# Patient Record
Sex: Male | Born: 1975 | Race: Black or African American | Hispanic: No | Marital: Married | Smoking: Never smoker
Health system: Southern US, Community
[De-identification: ages and names within clinical notes are randomized; demographics above are authoritative.]

## PROBLEM LIST (undated history)

## (undated) DIAGNOSIS — I10 Essential (primary) hypertension: Secondary | ICD-10-CM

## (undated) DIAGNOSIS — T7840XA Allergy, unspecified, initial encounter: Secondary | ICD-10-CM

## (undated) DIAGNOSIS — J45909 Unspecified asthma, uncomplicated: Secondary | ICD-10-CM

## (undated) DIAGNOSIS — Z9119 Patient's noncompliance with other medical treatment and regimen: Secondary | ICD-10-CM

## (undated) DIAGNOSIS — Z91199 Patient's noncompliance with other medical treatment and regimen due to unspecified reason: Secondary | ICD-10-CM

## (undated) HISTORY — DX: Allergy, unspecified, initial encounter: T78.40XA

## (undated) HISTORY — PX: APPENDECTOMY: SHX54

## (undated) HISTORY — PX: HERNIA REPAIR: SHX51

## (undated) HISTORY — PX: KNEE SURGERY: SHX244

## (undated) HISTORY — DX: Unspecified asthma, uncomplicated: J45.909

## (undated) HISTORY — PX: FRACTURE SURGERY: SHX138

---

## 1997-05-14 ENCOUNTER — Emergency Department (HOSPITAL_COMMUNITY): Admission: EM | Admit: 1997-05-14 | Discharge: 1997-05-14 | Payer: Self-pay | Admitting: Emergency Medicine

## 1997-12-16 ENCOUNTER — Encounter: Payer: Self-pay | Admitting: Emergency Medicine

## 1997-12-16 ENCOUNTER — Emergency Department (HOSPITAL_COMMUNITY): Admission: EM | Admit: 1997-12-16 | Discharge: 1997-12-16 | Payer: Self-pay | Admitting: Emergency Medicine

## 1998-04-12 ENCOUNTER — Emergency Department (HOSPITAL_COMMUNITY): Admission: EM | Admit: 1998-04-12 | Discharge: 1998-04-12 | Payer: Self-pay | Admitting: Emergency Medicine

## 1998-06-07 ENCOUNTER — Emergency Department (HOSPITAL_COMMUNITY): Admission: EM | Admit: 1998-06-07 | Discharge: 1998-06-07 | Payer: Self-pay | Admitting: Emergency Medicine

## 1998-07-01 ENCOUNTER — Emergency Department (HOSPITAL_COMMUNITY): Admission: EM | Admit: 1998-07-01 | Discharge: 1998-07-01 | Payer: Self-pay | Admitting: Emergency Medicine

## 1998-07-26 ENCOUNTER — Emergency Department (HOSPITAL_COMMUNITY): Admission: EM | Admit: 1998-07-26 | Discharge: 1998-07-26 | Payer: Self-pay | Admitting: Endocrinology

## 1998-10-06 ENCOUNTER — Encounter: Payer: Self-pay | Admitting: Emergency Medicine

## 1998-10-06 ENCOUNTER — Emergency Department (HOSPITAL_COMMUNITY): Admission: EM | Admit: 1998-10-06 | Discharge: 1998-10-06 | Payer: Self-pay | Admitting: Emergency Medicine

## 1998-10-09 ENCOUNTER — Emergency Department (HOSPITAL_COMMUNITY): Admission: EM | Admit: 1998-10-09 | Discharge: 1998-10-09 | Payer: Self-pay | Admitting: Emergency Medicine

## 1998-10-15 ENCOUNTER — Emergency Department (HOSPITAL_COMMUNITY): Admission: EM | Admit: 1998-10-15 | Discharge: 1998-10-15 | Payer: Self-pay | Admitting: Emergency Medicine

## 1998-12-22 ENCOUNTER — Emergency Department (HOSPITAL_COMMUNITY): Admission: EM | Admit: 1998-12-22 | Discharge: 1998-12-22 | Payer: Self-pay | Admitting: Internal Medicine

## 1999-01-05 ENCOUNTER — Emergency Department (HOSPITAL_COMMUNITY): Admission: EM | Admit: 1999-01-05 | Discharge: 1999-01-05 | Payer: Self-pay | Admitting: Emergency Medicine

## 1999-01-23 ENCOUNTER — Emergency Department (HOSPITAL_COMMUNITY): Admission: EM | Admit: 1999-01-23 | Discharge: 1999-01-23 | Payer: Self-pay | Admitting: Emergency Medicine

## 1999-01-25 ENCOUNTER — Emergency Department (HOSPITAL_COMMUNITY): Admission: EM | Admit: 1999-01-25 | Discharge: 1999-01-25 | Payer: Self-pay | Admitting: Emergency Medicine

## 1999-04-01 ENCOUNTER — Encounter (INDEPENDENT_AMBULATORY_CARE_PROVIDER_SITE_OTHER): Payer: Self-pay | Admitting: Specialist

## 1999-04-02 ENCOUNTER — Inpatient Hospital Stay (HOSPITAL_COMMUNITY): Admission: EM | Admit: 1999-04-02 | Discharge: 1999-04-03 | Payer: Self-pay | Admitting: Emergency Medicine

## 1999-04-02 ENCOUNTER — Encounter: Payer: Self-pay | Admitting: *Deleted

## 1999-06-14 ENCOUNTER — Emergency Department (HOSPITAL_COMMUNITY): Admission: EM | Admit: 1999-06-14 | Discharge: 1999-06-14 | Payer: Self-pay | Admitting: Emergency Medicine

## 1999-06-15 ENCOUNTER — Encounter: Payer: Self-pay | Admitting: Emergency Medicine

## 1999-07-02 ENCOUNTER — Emergency Department (HOSPITAL_COMMUNITY): Admission: EM | Admit: 1999-07-02 | Discharge: 1999-07-02 | Payer: Self-pay | Admitting: Emergency Medicine

## 1999-10-17 ENCOUNTER — Emergency Department (HOSPITAL_COMMUNITY): Admission: EM | Admit: 1999-10-17 | Discharge: 1999-10-17 | Payer: Self-pay | Admitting: Emergency Medicine

## 1999-10-17 ENCOUNTER — Encounter: Payer: Self-pay | Admitting: Emergency Medicine

## 1999-11-21 ENCOUNTER — Encounter: Payer: Self-pay | Admitting: Emergency Medicine

## 1999-11-21 ENCOUNTER — Emergency Department (HOSPITAL_COMMUNITY): Admission: EM | Admit: 1999-11-21 | Discharge: 1999-11-21 | Payer: Self-pay | Admitting: Emergency Medicine

## 1999-11-28 ENCOUNTER — Inpatient Hospital Stay (HOSPITAL_COMMUNITY): Admission: EM | Admit: 1999-11-28 | Discharge: 1999-11-28 | Payer: Self-pay | Admitting: Emergency Medicine

## 1999-11-30 ENCOUNTER — Encounter (INDEPENDENT_AMBULATORY_CARE_PROVIDER_SITE_OTHER): Payer: Self-pay | Admitting: Specialist

## 1999-11-30 ENCOUNTER — Ambulatory Visit (HOSPITAL_COMMUNITY): Admission: RE | Admit: 1999-11-30 | Discharge: 1999-11-30 | Payer: Self-pay | Admitting: Gastroenterology

## 2000-01-26 ENCOUNTER — Encounter: Payer: Self-pay | Admitting: Emergency Medicine

## 2000-01-26 ENCOUNTER — Emergency Department (HOSPITAL_COMMUNITY): Admission: AD | Admit: 2000-01-26 | Discharge: 2000-01-27 | Payer: Self-pay | Admitting: Emergency Medicine

## 2000-01-26 ENCOUNTER — Emergency Department (HOSPITAL_COMMUNITY): Admission: EM | Admit: 2000-01-26 | Discharge: 2000-01-26 | Payer: Self-pay | Admitting: Emergency Medicine

## 2000-02-19 ENCOUNTER — Emergency Department (HOSPITAL_COMMUNITY): Admission: EM | Admit: 2000-02-19 | Discharge: 2000-02-20 | Payer: Self-pay | Admitting: Emergency Medicine

## 2000-03-30 ENCOUNTER — Emergency Department (HOSPITAL_COMMUNITY): Admission: EM | Admit: 2000-03-30 | Discharge: 2000-03-30 | Payer: Self-pay | Admitting: Emergency Medicine

## 2000-03-31 ENCOUNTER — Encounter: Payer: Self-pay | Admitting: Emergency Medicine

## 2000-05-09 ENCOUNTER — Encounter: Payer: Self-pay | Admitting: Emergency Medicine

## 2000-05-09 ENCOUNTER — Emergency Department (HOSPITAL_COMMUNITY): Admission: EM | Admit: 2000-05-09 | Discharge: 2000-05-09 | Payer: Self-pay | Admitting: Emergency Medicine

## 2000-08-20 ENCOUNTER — Encounter: Payer: Self-pay | Admitting: Emergency Medicine

## 2000-08-20 ENCOUNTER — Emergency Department (HOSPITAL_COMMUNITY): Admission: EM | Admit: 2000-08-20 | Discharge: 2000-08-20 | Payer: Self-pay | Admitting: Emergency Medicine

## 2000-09-16 ENCOUNTER — Emergency Department (HOSPITAL_COMMUNITY): Admission: EM | Admit: 2000-09-16 | Discharge: 2000-09-16 | Payer: Self-pay | Admitting: Emergency Medicine

## 2000-09-16 ENCOUNTER — Encounter: Payer: Self-pay | Admitting: Emergency Medicine

## 2000-09-20 ENCOUNTER — Emergency Department (HOSPITAL_COMMUNITY): Admission: EM | Admit: 2000-09-20 | Discharge: 2000-09-20 | Payer: Self-pay | Admitting: Emergency Medicine

## 2000-10-01 ENCOUNTER — Emergency Department (HOSPITAL_COMMUNITY): Admission: EM | Admit: 2000-10-01 | Discharge: 2000-10-01 | Payer: Self-pay

## 2000-12-14 ENCOUNTER — Emergency Department (HOSPITAL_COMMUNITY): Admission: EM | Admit: 2000-12-14 | Discharge: 2000-12-14 | Payer: Self-pay | Admitting: Emergency Medicine

## 2001-02-06 ENCOUNTER — Emergency Department (HOSPITAL_COMMUNITY): Admission: EM | Admit: 2001-02-06 | Discharge: 2001-02-06 | Payer: Self-pay | Admitting: *Deleted

## 2001-05-08 ENCOUNTER — Encounter: Payer: Self-pay | Admitting: Emergency Medicine

## 2001-05-08 ENCOUNTER — Emergency Department (HOSPITAL_COMMUNITY): Admission: EM | Admit: 2001-05-08 | Discharge: 2001-05-08 | Payer: Self-pay | Admitting: Emergency Medicine

## 2001-11-26 ENCOUNTER — Emergency Department (HOSPITAL_COMMUNITY): Admission: EM | Admit: 2001-11-26 | Discharge: 2001-11-27 | Payer: Self-pay | Admitting: *Deleted

## 2001-11-28 ENCOUNTER — Emergency Department (HOSPITAL_COMMUNITY): Admission: EM | Admit: 2001-11-28 | Discharge: 2001-11-28 | Payer: Self-pay | Admitting: Emergency Medicine

## 2002-01-16 ENCOUNTER — Emergency Department (HOSPITAL_COMMUNITY): Admission: EM | Admit: 2002-01-16 | Discharge: 2002-01-16 | Payer: Self-pay | Admitting: Emergency Medicine

## 2002-01-16 ENCOUNTER — Encounter: Payer: Self-pay | Admitting: Emergency Medicine

## 2002-04-04 ENCOUNTER — Emergency Department (HOSPITAL_COMMUNITY): Admission: EM | Admit: 2002-04-04 | Discharge: 2002-04-04 | Payer: Self-pay

## 2002-04-04 ENCOUNTER — Encounter: Payer: Self-pay | Admitting: Emergency Medicine

## 2002-04-25 ENCOUNTER — Emergency Department (HOSPITAL_COMMUNITY): Admission: EM | Admit: 2002-04-25 | Discharge: 2002-04-26 | Payer: Self-pay | Admitting: Unknown Physician Specialty

## 2002-05-19 ENCOUNTER — Emergency Department (HOSPITAL_COMMUNITY): Admission: EM | Admit: 2002-05-19 | Discharge: 2002-05-19 | Payer: Self-pay

## 2002-05-19 ENCOUNTER — Emergency Department (HOSPITAL_COMMUNITY): Admission: EM | Admit: 2002-05-19 | Discharge: 2002-05-19 | Payer: Self-pay | Admitting: Unknown Physician Specialty

## 2002-05-23 ENCOUNTER — Emergency Department (HOSPITAL_COMMUNITY): Admission: EM | Admit: 2002-05-23 | Discharge: 2002-05-23 | Payer: Self-pay | Admitting: Emergency Medicine

## 2002-06-18 ENCOUNTER — Emergency Department (HOSPITAL_COMMUNITY): Admission: EM | Admit: 2002-06-18 | Discharge: 2002-06-18 | Payer: Self-pay | Admitting: Emergency Medicine

## 2002-06-18 ENCOUNTER — Encounter: Payer: Self-pay | Admitting: Emergency Medicine

## 2002-08-16 ENCOUNTER — Emergency Department (HOSPITAL_COMMUNITY): Admission: EM | Admit: 2002-08-16 | Discharge: 2002-08-17 | Payer: Self-pay | Admitting: Emergency Medicine

## 2002-08-19 ENCOUNTER — Inpatient Hospital Stay (HOSPITAL_COMMUNITY): Admission: EM | Admit: 2002-08-19 | Discharge: 2002-08-20 | Payer: Self-pay | Admitting: Emergency Medicine

## 2003-02-08 ENCOUNTER — Emergency Department (HOSPITAL_COMMUNITY): Admission: EM | Admit: 2003-02-08 | Discharge: 2003-02-08 | Payer: Self-pay | Admitting: Emergency Medicine

## 2003-04-16 ENCOUNTER — Emergency Department (HOSPITAL_COMMUNITY): Admission: EM | Admit: 2003-04-16 | Discharge: 2003-04-17 | Payer: Self-pay | Admitting: Emergency Medicine

## 2003-04-22 ENCOUNTER — Emergency Department (HOSPITAL_COMMUNITY): Admission: EM | Admit: 2003-04-22 | Discharge: 2003-04-22 | Payer: Self-pay | Admitting: Emergency Medicine

## 2003-08-10 ENCOUNTER — Emergency Department (HOSPITAL_COMMUNITY): Admission: EM | Admit: 2003-08-10 | Discharge: 2003-08-10 | Payer: Self-pay | Admitting: Emergency Medicine

## 2003-08-31 ENCOUNTER — Emergency Department (HOSPITAL_COMMUNITY): Admission: EM | Admit: 2003-08-31 | Discharge: 2003-09-01 | Payer: Self-pay | Admitting: Emergency Medicine

## 2003-10-11 ENCOUNTER — Ambulatory Visit: Payer: Self-pay | Admitting: Internal Medicine

## 2003-11-08 ENCOUNTER — Ambulatory Visit: Payer: Self-pay | Admitting: Internal Medicine

## 2003-11-25 ENCOUNTER — Ambulatory Visit: Payer: Self-pay | Admitting: Internal Medicine

## 2003-12-20 ENCOUNTER — Ambulatory Visit: Payer: Self-pay | Admitting: Internal Medicine

## 2004-01-27 ENCOUNTER — Ambulatory Visit: Payer: Self-pay | Admitting: Internal Medicine

## 2004-01-27 ENCOUNTER — Ambulatory Visit: Payer: Self-pay | Admitting: *Deleted

## 2004-02-10 ENCOUNTER — Ambulatory Visit: Payer: Self-pay | Admitting: Internal Medicine

## 2004-02-11 ENCOUNTER — Emergency Department (HOSPITAL_COMMUNITY): Admission: EM | Admit: 2004-02-11 | Discharge: 2004-02-12 | Payer: Self-pay | Admitting: Emergency Medicine

## 2004-02-13 ENCOUNTER — Ambulatory Visit: Payer: Self-pay | Admitting: Internal Medicine

## 2004-03-04 ENCOUNTER — Inpatient Hospital Stay (HOSPITAL_COMMUNITY): Admission: EM | Admit: 2004-03-04 | Discharge: 2004-03-05 | Payer: Self-pay | Admitting: Emergency Medicine

## 2004-03-09 ENCOUNTER — Ambulatory Visit: Payer: Self-pay | Admitting: Internal Medicine

## 2004-03-13 ENCOUNTER — Ambulatory Visit: Payer: Self-pay | Admitting: Internal Medicine

## 2004-05-01 ENCOUNTER — Ambulatory Visit: Payer: Self-pay | Admitting: Internal Medicine

## 2004-06-02 ENCOUNTER — Ambulatory Visit: Payer: Self-pay | Admitting: Family Medicine

## 2004-07-07 ENCOUNTER — Emergency Department (HOSPITAL_COMMUNITY): Admission: EM | Admit: 2004-07-07 | Discharge: 2004-07-07 | Payer: Self-pay | Admitting: Emergency Medicine

## 2004-07-22 ENCOUNTER — Emergency Department (HOSPITAL_COMMUNITY): Admission: EM | Admit: 2004-07-22 | Discharge: 2004-07-22 | Payer: Self-pay | Admitting: Family Medicine

## 2004-08-10 ENCOUNTER — Emergency Department (HOSPITAL_COMMUNITY): Admission: EM | Admit: 2004-08-10 | Discharge: 2004-08-10 | Payer: Self-pay | Admitting: Emergency Medicine

## 2004-09-30 ENCOUNTER — Emergency Department (HOSPITAL_COMMUNITY): Admission: EM | Admit: 2004-09-30 | Discharge: 2004-09-30 | Payer: Self-pay | Admitting: Emergency Medicine

## 2004-11-09 ENCOUNTER — Emergency Department (HOSPITAL_COMMUNITY): Admission: EM | Admit: 2004-11-09 | Discharge: 2004-11-10 | Payer: Self-pay | Admitting: Emergency Medicine

## 2004-11-18 ENCOUNTER — Emergency Department (HOSPITAL_COMMUNITY): Admission: EM | Admit: 2004-11-18 | Discharge: 2004-11-18 | Payer: Self-pay | Admitting: Emergency Medicine

## 2004-12-14 ENCOUNTER — Emergency Department (HOSPITAL_COMMUNITY): Admission: EM | Admit: 2004-12-14 | Discharge: 2004-12-14 | Payer: Self-pay | Admitting: Emergency Medicine

## 2004-12-19 ENCOUNTER — Emergency Department (HOSPITAL_COMMUNITY): Admission: EM | Admit: 2004-12-19 | Discharge: 2004-12-19 | Payer: Self-pay | Admitting: Family Medicine

## 2005-01-07 ENCOUNTER — Ambulatory Visit: Payer: Self-pay | Admitting: Internal Medicine

## 2005-01-15 ENCOUNTER — Emergency Department (HOSPITAL_COMMUNITY): Admission: EM | Admit: 2005-01-15 | Discharge: 2005-01-15 | Payer: Self-pay | Admitting: Emergency Medicine

## 2005-03-25 ENCOUNTER — Emergency Department (HOSPITAL_COMMUNITY): Admission: EM | Admit: 2005-03-25 | Discharge: 2005-03-25 | Payer: Self-pay | Admitting: Emergency Medicine

## 2005-04-13 ENCOUNTER — Ambulatory Visit: Payer: Self-pay | Admitting: Internal Medicine

## 2005-06-07 ENCOUNTER — Ambulatory Visit: Payer: Self-pay | Admitting: Internal Medicine

## 2005-06-29 ENCOUNTER — Emergency Department (HOSPITAL_COMMUNITY): Admission: EM | Admit: 2005-06-29 | Discharge: 2005-06-29 | Payer: Self-pay | Admitting: Emergency Medicine

## 2005-07-08 ENCOUNTER — Ambulatory Visit: Payer: Self-pay | Admitting: Internal Medicine

## 2005-07-12 ENCOUNTER — Ambulatory Visit: Payer: Self-pay | Admitting: Family Medicine

## 2005-07-19 ENCOUNTER — Ambulatory Visit: Payer: Self-pay | Admitting: Family Medicine

## 2005-08-04 ENCOUNTER — Emergency Department (HOSPITAL_COMMUNITY): Admission: EM | Admit: 2005-08-04 | Discharge: 2005-08-04 | Payer: Self-pay | Admitting: Emergency Medicine

## 2005-10-11 ENCOUNTER — Emergency Department (HOSPITAL_COMMUNITY): Admission: EM | Admit: 2005-10-11 | Discharge: 2005-10-11 | Payer: Self-pay | Admitting: Emergency Medicine

## 2005-10-13 ENCOUNTER — Ambulatory Visit: Payer: Self-pay | Admitting: Family Medicine

## 2005-10-15 ENCOUNTER — Ambulatory Visit (HOSPITAL_COMMUNITY): Admission: RE | Admit: 2005-10-15 | Discharge: 2005-10-15 | Payer: Self-pay | Admitting: Internal Medicine

## 2005-10-15 ENCOUNTER — Ambulatory Visit: Payer: Self-pay | Admitting: Internal Medicine

## 2005-10-18 ENCOUNTER — Emergency Department (HOSPITAL_COMMUNITY): Admission: EM | Admit: 2005-10-18 | Discharge: 2005-10-19 | Payer: Self-pay | Admitting: Emergency Medicine

## 2005-10-22 ENCOUNTER — Ambulatory Visit: Payer: Self-pay | Admitting: Internal Medicine

## 2005-10-29 ENCOUNTER — Ambulatory Visit: Payer: Self-pay | Admitting: Internal Medicine

## 2005-11-04 ENCOUNTER — Ambulatory Visit: Payer: Self-pay | Admitting: Internal Medicine

## 2005-11-11 ENCOUNTER — Ambulatory Visit (HOSPITAL_COMMUNITY): Admission: RE | Admit: 2005-11-11 | Discharge: 2005-11-11 | Payer: Self-pay | Admitting: Internal Medicine

## 2005-12-12 ENCOUNTER — Emergency Department (HOSPITAL_COMMUNITY): Admission: EM | Admit: 2005-12-12 | Discharge: 2005-12-12 | Payer: Self-pay | Admitting: Emergency Medicine

## 2005-12-13 ENCOUNTER — Ambulatory Visit: Payer: Self-pay | Admitting: Internal Medicine

## 2005-12-14 ENCOUNTER — Ambulatory Visit: Payer: Self-pay | Admitting: Internal Medicine

## 2006-02-09 ENCOUNTER — Emergency Department (HOSPITAL_COMMUNITY): Admission: EM | Admit: 2006-02-09 | Discharge: 2006-02-10 | Payer: Self-pay | Admitting: Emergency Medicine

## 2006-08-20 ENCOUNTER — Emergency Department (HOSPITAL_COMMUNITY): Admission: EM | Admit: 2006-08-20 | Discharge: 2006-08-20 | Payer: Self-pay | Admitting: Emergency Medicine

## 2006-10-26 ENCOUNTER — Encounter (INDEPENDENT_AMBULATORY_CARE_PROVIDER_SITE_OTHER): Payer: Self-pay | Admitting: *Deleted

## 2006-10-31 DIAGNOSIS — I10 Essential (primary) hypertension: Secondary | ICD-10-CM | POA: Insufficient documentation

## 2006-10-31 DIAGNOSIS — E1122 Type 2 diabetes mellitus with diabetic chronic kidney disease: Secondary | ICD-10-CM | POA: Insufficient documentation

## 2006-10-31 DIAGNOSIS — J45909 Unspecified asthma, uncomplicated: Secondary | ICD-10-CM | POA: Insufficient documentation

## 2006-10-31 DIAGNOSIS — N183 Chronic kidney disease, stage 3 (moderate): Secondary | ICD-10-CM

## 2006-12-26 ENCOUNTER — Emergency Department (HOSPITAL_COMMUNITY): Admission: EM | Admit: 2006-12-26 | Discharge: 2006-12-26 | Payer: Self-pay | Admitting: *Deleted

## 2007-01-09 ENCOUNTER — Emergency Department (HOSPITAL_COMMUNITY): Admission: EM | Admit: 2007-01-09 | Discharge: 2007-01-09 | Payer: Self-pay | Admitting: Emergency Medicine

## 2007-05-31 ENCOUNTER — Emergency Department (HOSPITAL_COMMUNITY): Admission: EM | Admit: 2007-05-31 | Discharge: 2007-05-31 | Payer: Self-pay | Admitting: Neurosurgery

## 2007-06-06 ENCOUNTER — Emergency Department (HOSPITAL_COMMUNITY): Admission: EM | Admit: 2007-06-06 | Discharge: 2007-06-07 | Payer: Self-pay | Admitting: Emergency Medicine

## 2007-08-01 ENCOUNTER — Encounter (INDEPENDENT_AMBULATORY_CARE_PROVIDER_SITE_OTHER): Payer: Self-pay | Admitting: Internal Medicine

## 2007-08-07 ENCOUNTER — Emergency Department (HOSPITAL_COMMUNITY): Admission: EM | Admit: 2007-08-07 | Discharge: 2007-08-08 | Payer: Self-pay | Admitting: Emergency Medicine

## 2007-08-17 ENCOUNTER — Emergency Department (HOSPITAL_COMMUNITY): Admission: EM | Admit: 2007-08-17 | Discharge: 2007-08-17 | Payer: Self-pay | Admitting: Emergency Medicine

## 2007-10-02 ENCOUNTER — Emergency Department (HOSPITAL_COMMUNITY): Admission: EM | Admit: 2007-10-02 | Discharge: 2007-10-02 | Payer: Self-pay | Admitting: Emergency Medicine

## 2007-10-24 ENCOUNTER — Emergency Department (HOSPITAL_COMMUNITY): Admission: EM | Admit: 2007-10-24 | Discharge: 2007-10-24 | Payer: Self-pay | Admitting: Emergency Medicine

## 2007-12-30 ENCOUNTER — Emergency Department (HOSPITAL_COMMUNITY): Admission: EM | Admit: 2007-12-30 | Discharge: 2007-12-30 | Payer: Self-pay | Admitting: *Deleted

## 2008-01-11 ENCOUNTER — Emergency Department (HOSPITAL_COMMUNITY): Admission: EM | Admit: 2008-01-11 | Discharge: 2008-01-11 | Payer: Self-pay | Admitting: Emergency Medicine

## 2008-01-17 ENCOUNTER — Ambulatory Visit: Payer: Self-pay | Admitting: Internal Medicine

## 2008-01-17 ENCOUNTER — Observation Stay (HOSPITAL_COMMUNITY): Admission: EM | Admit: 2008-01-17 | Discharge: 2008-01-18 | Payer: Self-pay | Admitting: Emergency Medicine

## 2008-04-20 ENCOUNTER — Emergency Department (HOSPITAL_COMMUNITY): Admission: EM | Admit: 2008-04-20 | Discharge: 2008-04-20 | Payer: Self-pay | Admitting: Emergency Medicine

## 2008-06-08 ENCOUNTER — Emergency Department (HOSPITAL_COMMUNITY): Admission: EM | Admit: 2008-06-08 | Discharge: 2008-06-08 | Payer: Self-pay | Admitting: Emergency Medicine

## 2008-07-14 ENCOUNTER — Emergency Department (HOSPITAL_COMMUNITY): Admission: EM | Admit: 2008-07-14 | Discharge: 2008-07-14 | Payer: Self-pay | Admitting: Emergency Medicine

## 2008-12-30 ENCOUNTER — Emergency Department (HOSPITAL_COMMUNITY): Admission: EM | Admit: 2008-12-30 | Discharge: 2008-12-30 | Payer: Self-pay | Admitting: Emergency Medicine

## 2009-02-03 ENCOUNTER — Emergency Department (HOSPITAL_COMMUNITY): Admission: EM | Admit: 2009-02-03 | Discharge: 2009-02-04 | Payer: Self-pay | Admitting: Emergency Medicine

## 2009-03-14 IMAGING — CR DG WRIST COMPLETE 3+V*L*
4 series · 4 of 4 positions shown · non-contrast
Comparison: Left hand series 10/18/2005.

CLINICAL DATA: 32-year-old male status post fall 2 weeks ago with
fifth metacarpal pain extending into the wrist.

LEFT WRIST - COMPLETE 3+ VIEW

[x wrist pa left]
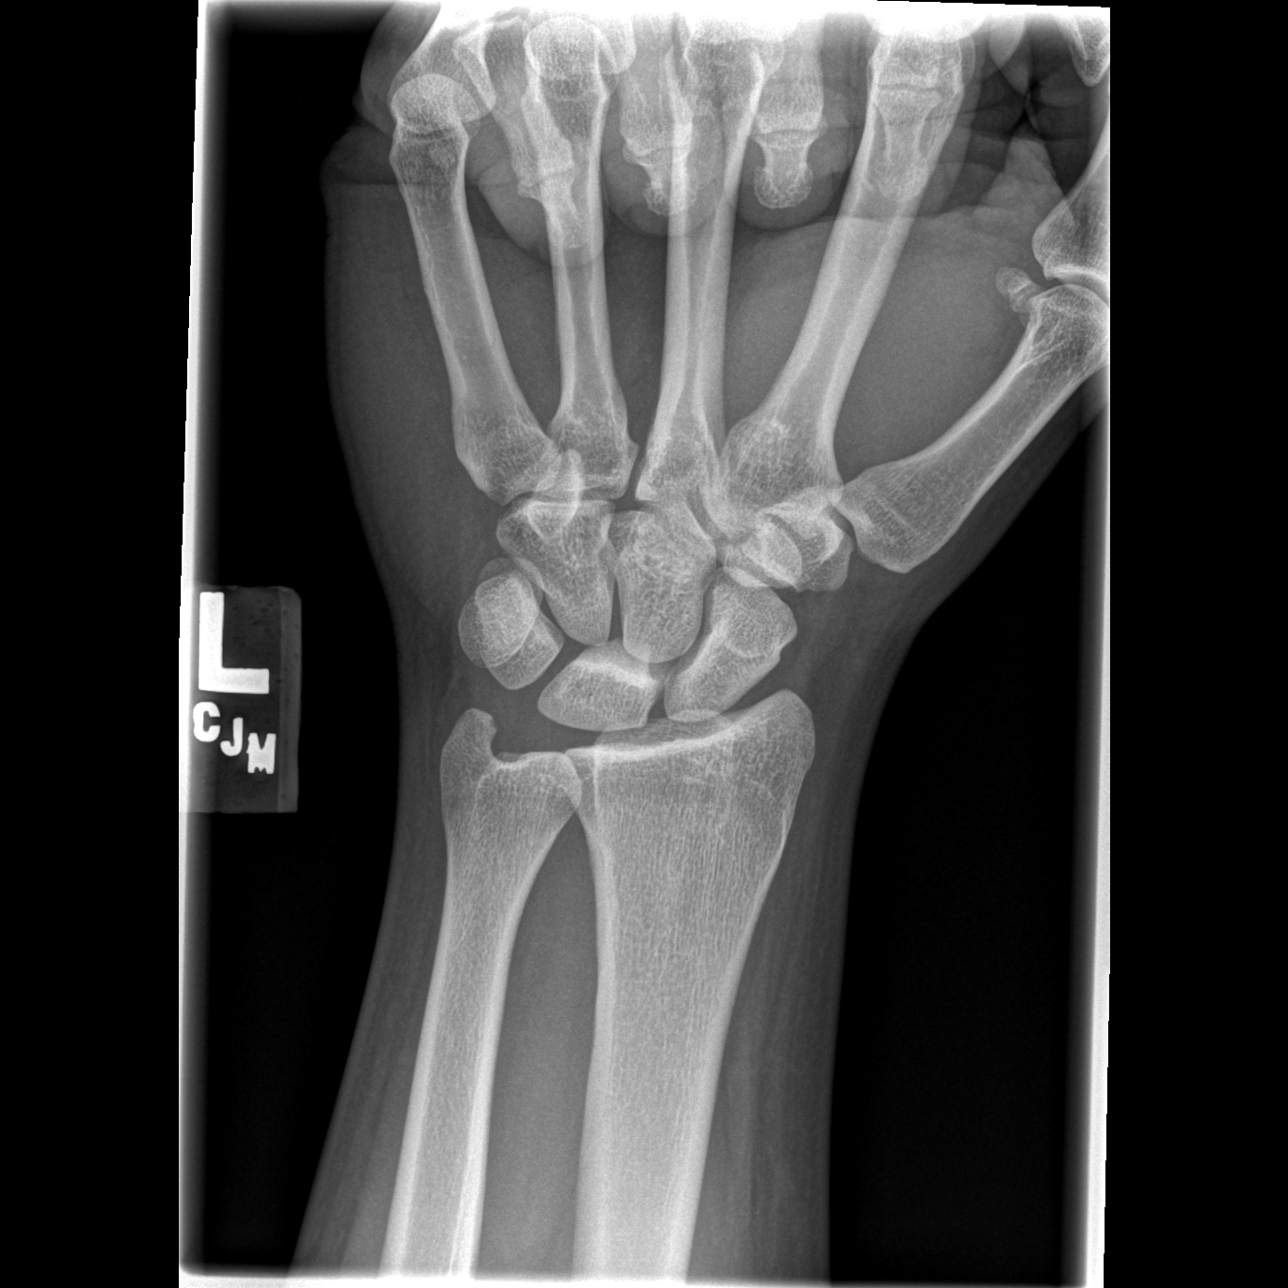

[x wrist obl left]
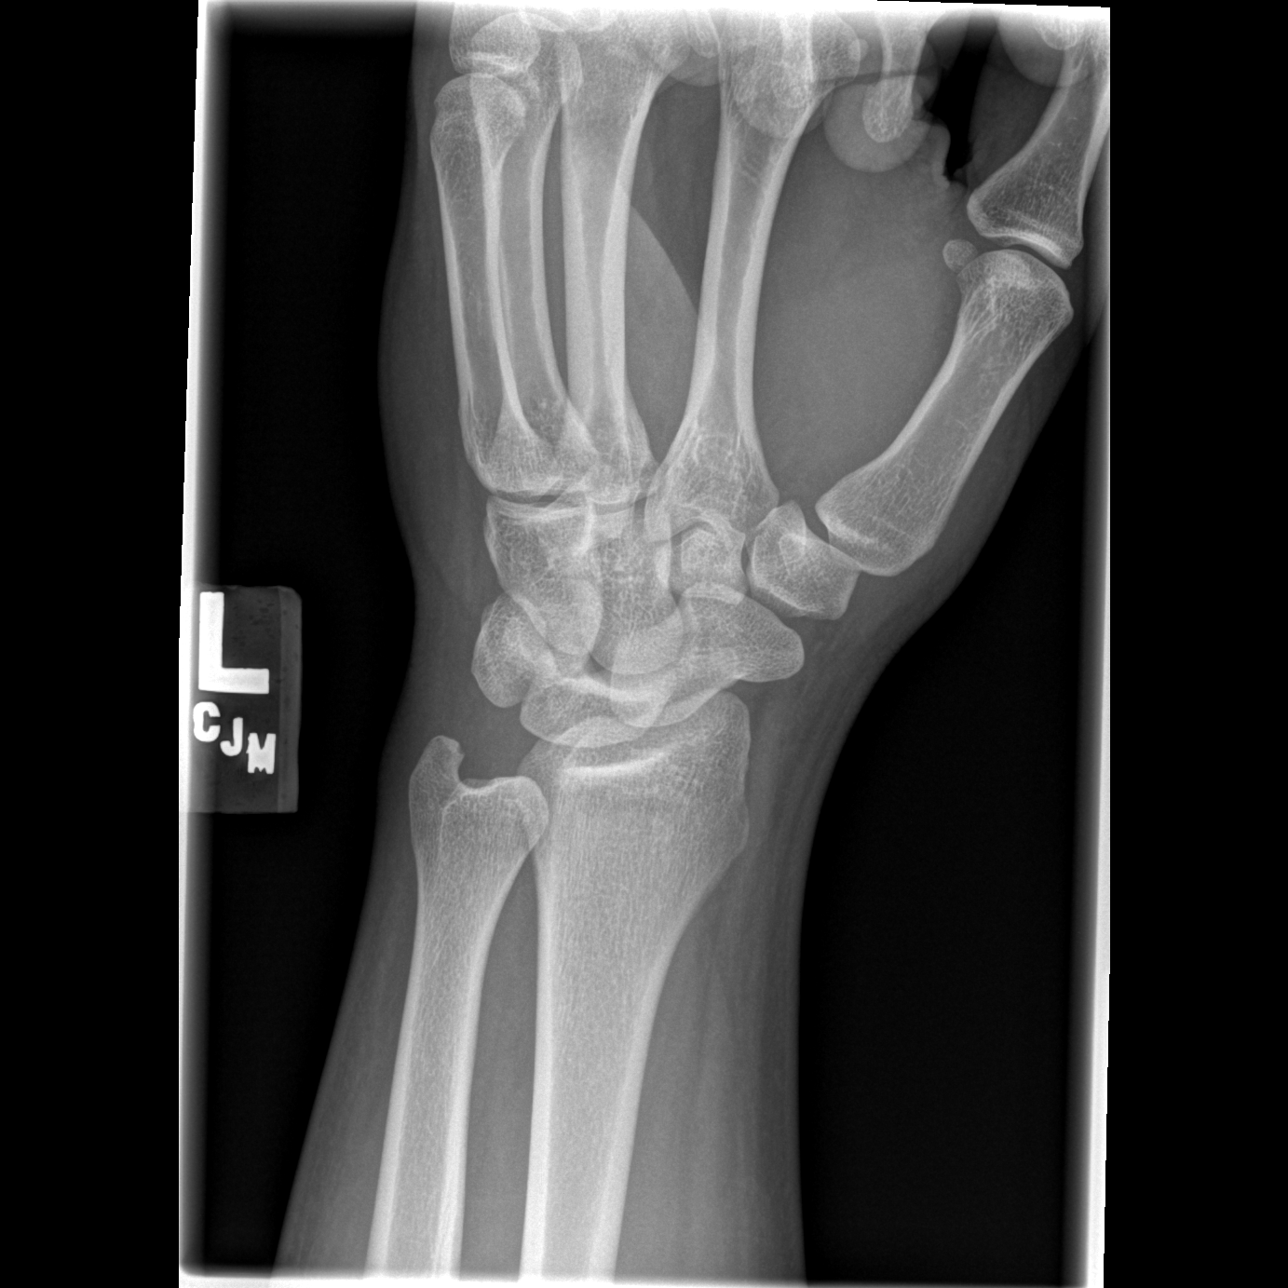

[x wrist lat left]
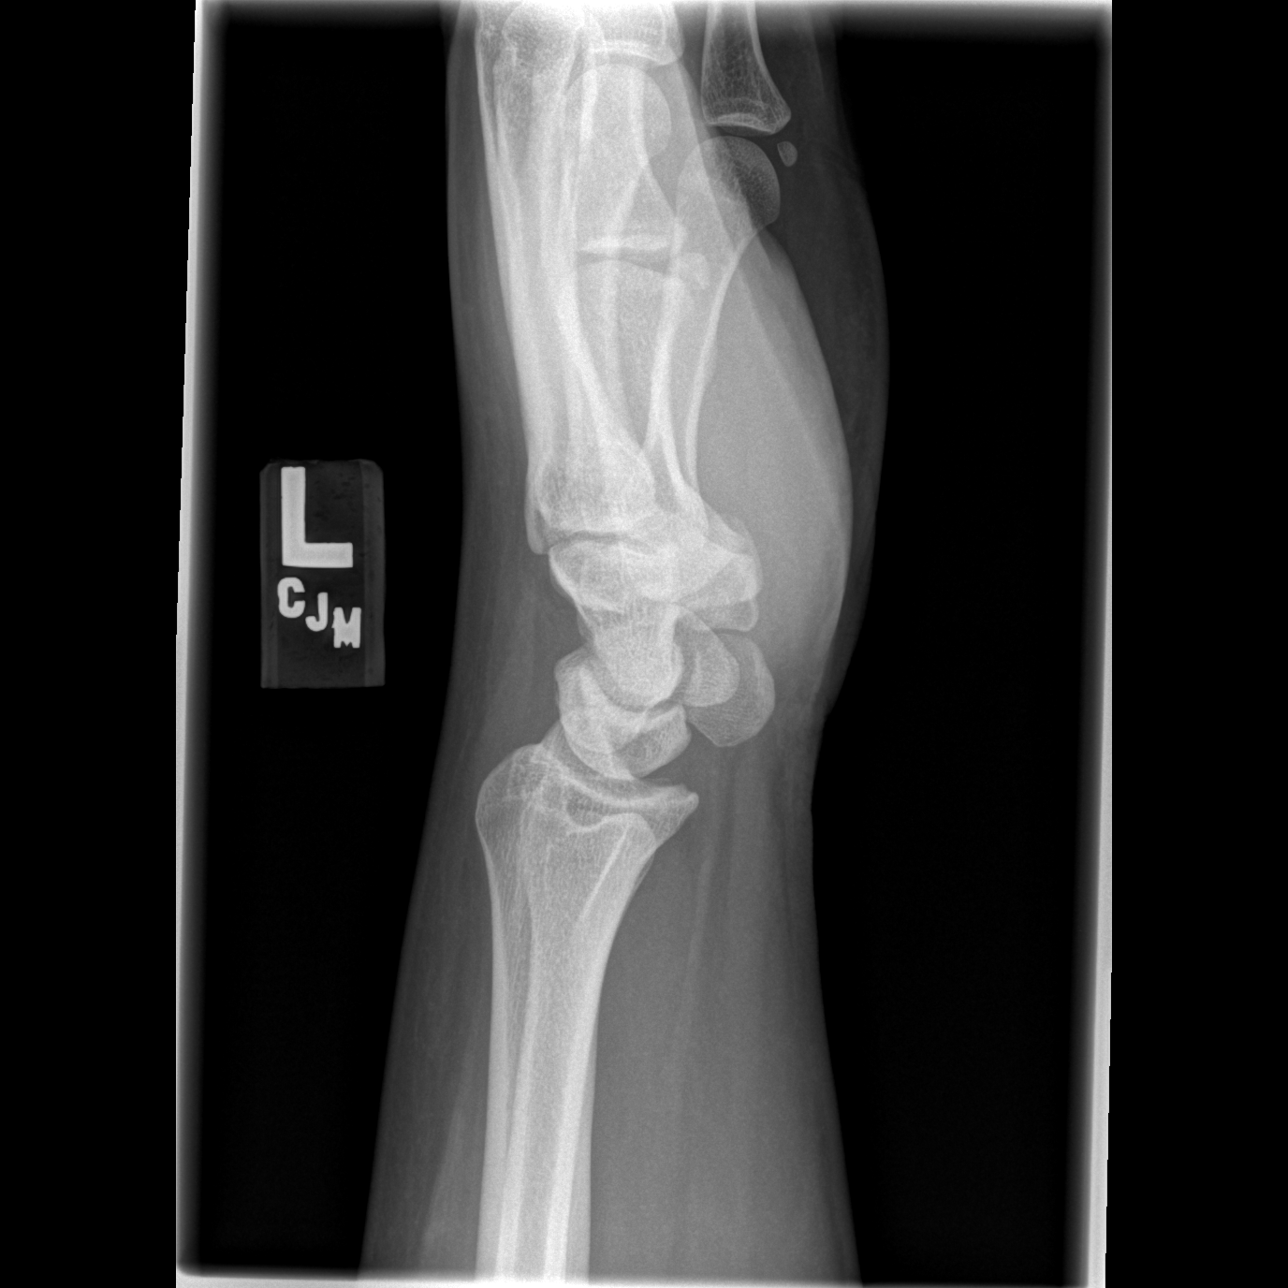

[x navicular]
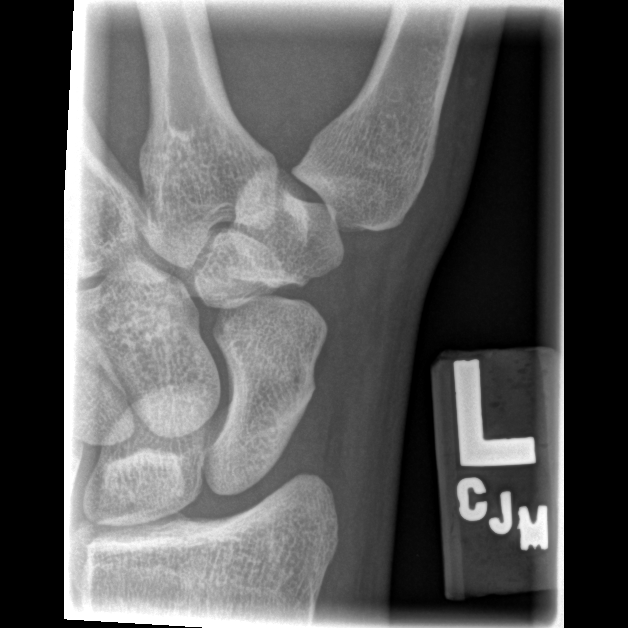

[4 of 4 positions shown; findings below may reference images not displayed]

FINDINGS: Left wrist was not included on the previous left hand
series. Bone mineralization is within normal limits.  The scaphoid
appears intact.  Radiocarpal and carpal bone alignment appear
within normal limits.  The distal left radius and ulna are intact.
The visualized fifth metacarpal appears intact.
IMPRESSION: No acute or subacute fracture or dislocation identified about the
left wrist.

## 2009-05-03 ENCOUNTER — Emergency Department (HOSPITAL_COMMUNITY): Admission: EM | Admit: 2009-05-03 | Discharge: 2009-05-03 | Payer: Self-pay | Admitting: Emergency Medicine

## 2009-09-06 ENCOUNTER — Emergency Department (HOSPITAL_COMMUNITY): Admission: EM | Admit: 2009-09-06 | Discharge: 2009-09-06 | Payer: Self-pay | Admitting: Emergency Medicine

## 2009-11-11 ENCOUNTER — Emergency Department (HOSPITAL_COMMUNITY): Admission: EM | Admit: 2009-11-11 | Discharge: 2009-11-12 | Payer: Self-pay | Admitting: Emergency Medicine

## 2010-02-14 ENCOUNTER — Emergency Department (HOSPITAL_COMMUNITY)
Admission: EM | Admit: 2010-02-14 | Discharge: 2010-02-15 | Payer: Self-pay | Source: Home / Self Care | Admitting: Emergency Medicine

## 2010-02-23 LAB — CBC
HCT: 43.3 % (ref 39.0–52.0)
Hemoglobin: 14.9 g/dL (ref 13.0–17.0)
MCH: 26.7 pg (ref 26.0–34.0)
MCHC: 34.4 g/dL (ref 30.0–36.0)
MCV: 77.5 fL — ABNORMAL LOW (ref 78.0–100.0)
Platelets: 220 10*3/uL (ref 150–400)
RBC: 5.59 MIL/uL (ref 4.22–5.81)
RDW: 13.1 % (ref 11.5–15.5)
WBC: 7 10*3/uL (ref 4.0–10.5)

## 2010-02-23 LAB — DIFFERENTIAL
Basophils Absolute: 0.1 10*3/uL (ref 0.0–0.1)
Basophils Relative: 1 % (ref 0–1)
Eosinophils Absolute: 0.2 10*3/uL (ref 0.0–0.7)
Eosinophils Relative: 2 % (ref 0–5)
Lymphocytes Relative: 35 % (ref 12–46)
Lymphs Abs: 2.4 10*3/uL (ref 0.7–4.0)
Monocytes Absolute: 0.6 10*3/uL (ref 0.1–1.0)
Monocytes Relative: 9 % (ref 3–12)
Neutro Abs: 3.7 10*3/uL (ref 1.7–7.7)
Neutrophils Relative %: 53 % (ref 43–77)

## 2010-02-23 LAB — GLUCOSE, CAPILLARY
Glucose-Capillary: 140 mg/dL — ABNORMAL HIGH (ref 70–99)
Glucose-Capillary: 312 mg/dL — ABNORMAL HIGH (ref 70–99)
Glucose-Capillary: 561 mg/dL (ref 70–99)

## 2010-02-23 LAB — URINE MICROSCOPIC-ADD ON

## 2010-02-23 LAB — URINALYSIS, ROUTINE W REFLEX MICROSCOPIC
Bilirubin Urine: NEGATIVE
Hgb urine dipstick: NEGATIVE
Ketones, ur: NEGATIVE mg/dL
Leukocytes, UA: NEGATIVE
Nitrite: NEGATIVE
Protein, ur: NEGATIVE mg/dL
Specific Gravity, Urine: 1.035 — ABNORMAL HIGH (ref 1.005–1.030)
Urine Glucose, Fasting: >1000 mg/dL — AB
Urobilinogen, UA: 0.2 mg/dL (ref 0.0–1.0)
pH: 5.5 (ref 5.0–8.0)

## 2010-02-23 LAB — POCT I-STAT, CHEM 8
BUN: 12 mg/dL (ref 6–23)
Calcium, Ion: 1.16 mmol/L (ref 1.12–1.32)
Chloride: 96 mEq/L (ref 96–112)
Creatinine, Ser: 1.3 mg/dL (ref 0.4–1.5)
Glucose, Bld: 591 mg/dL (ref 70–99)
HCT: 48 % (ref 39.0–52.0)
Hemoglobin: 16.3 g/dL (ref 13.0–17.0)
Potassium: 4.4 mEq/L (ref 3.5–5.1)
Sodium: 133 mEq/L — ABNORMAL LOW (ref 135–145)
TCO2: 28 mmol/L (ref 0–100)

## 2010-02-23 LAB — POCT CARDIAC MARKERS
CKMB, poc: <1 ng/mL — ABNORMAL LOW (ref 1.0–8.0)
Myoglobin, poc: 54 ng/mL (ref 12–200)
Troponin i, poc: <0.05 ng/mL (ref 0.00–0.09)

## 2010-03-05 ENCOUNTER — Emergency Department (HOSPITAL_COMMUNITY)
Admission: EM | Admit: 2010-03-05 | Discharge: 2010-03-05 | Payer: Self-pay | Source: Home / Self Care | Admitting: Emergency Medicine

## 2010-05-01 LAB — URINALYSIS, ROUTINE W REFLEX MICROSCOPIC
Bilirubin Urine: NEGATIVE
Glucose, UA: >1000 mg/dL — AB
Hgb urine dipstick: NEGATIVE
Ketones, ur: NEGATIVE mg/dL
Leukocytes, UA: NEGATIVE
Nitrite: NEGATIVE
Protein, ur: NEGATIVE mg/dL
Specific Gravity, Urine: 1.031 — ABNORMAL HIGH (ref 1.005–1.030)
Urobilinogen, UA: 0.2 mg/dL (ref 0.0–1.0)
pH: 5 (ref 5.0–8.0)

## 2010-05-01 LAB — GLUCOSE, CAPILLARY
Glucose-Capillary: 349 mg/dL — ABNORMAL HIGH (ref 70–99)
Glucose-Capillary: 494 mg/dL — ABNORMAL HIGH (ref 70–99)

## 2010-05-01 LAB — URINE MICROSCOPIC-ADD ON

## 2010-05-11 LAB — BASIC METABOLIC PANEL
BUN: 11 mg/dL (ref 6–23)
Calcium: 9.2 mg/dL (ref 8.4–10.5)
Chloride: 98 mEq/L (ref 96–112)
Potassium: 4.4 mEq/L (ref 3.5–5.1)
Sodium: 134 mEq/L — ABNORMAL LOW (ref 135–145)

## 2010-05-11 LAB — URINALYSIS, ROUTINE W REFLEX MICROSCOPIC
Bilirubin Urine: NEGATIVE
Glucose, UA: >1000 mg/dL — AB
Hgb urine dipstick: NEGATIVE
Ketones, ur: NEGATIVE mg/dL
Nitrite: NEGATIVE
Specific Gravity, Urine: 1.03 (ref 1.005–1.030)
Urobilinogen, UA: 0.2 mg/dL (ref 0.0–1.0)
pH: 6 (ref 5.0–8.0)

## 2010-05-11 LAB — GLUCOSE, CAPILLARY: Glucose-Capillary: 475 mg/dL — ABNORMAL HIGH (ref 70–99)

## 2010-05-13 LAB — DIFFERENTIAL
Eosinophils Relative: 2 % (ref 0–5)
Neutro Abs: 4.4 10*3/uL (ref 1.7–7.7)
Neutrophils Relative %: 53 % (ref 43–77)

## 2010-05-13 LAB — GLUCOSE, CAPILLARY

## 2010-05-13 LAB — CBC
HCT: 45.8 % (ref 39.0–52.0)
Hemoglobin: 15.3 g/dL (ref 13.0–17.0)
MCHC: 33.4 g/dL (ref 30.0–36.0)
Platelets: 222 10*3/uL (ref 150–400)
RDW: 13.9 % (ref 11.5–15.5)

## 2010-05-13 LAB — POCT CARDIAC MARKERS
CKMB, poc: <1 ng/mL — ABNORMAL LOW (ref 1.0–8.0)
Troponin i, poc: <0.05 ng/mL (ref 0.00–0.09)

## 2010-05-13 LAB — BASIC METABOLIC PANEL
BUN: 13 mg/dL (ref 6–23)
CO2: 28 mEq/L (ref 19–32)
Chloride: 98 mEq/L (ref 96–112)
Creatinine, Ser: 1.29 mg/dL (ref 0.4–1.5)
GFR calc Af Amer: >60 mL/min (ref >60)
Glucose, Bld: 484 mg/dL — ABNORMAL HIGH (ref 70–99)
Sodium: 135 mEq/L (ref 135–145)

## 2010-05-19 LAB — CULTURE, ROUTINE-ABSCESS

## 2010-05-27 ENCOUNTER — Emergency Department (HOSPITAL_COMMUNITY)
Admission: EM | Admit: 2010-05-27 | Discharge: 2010-05-28 | Disposition: A | Discharge status: Home / Self Care | Payer: Self-pay | Attending: Emergency Medicine | Admitting: Emergency Medicine

## 2010-05-27 DIAGNOSIS — M940 Chondrocostal junction syndrome [Tietze]: Secondary | ICD-10-CM | POA: Insufficient documentation

## 2010-05-27 DIAGNOSIS — R072 Precordial pain: Secondary | ICD-10-CM | POA: Insufficient documentation

## 2010-05-27 DIAGNOSIS — E1169 Type 2 diabetes mellitus with other specified complication: Secondary | ICD-10-CM | POA: Insufficient documentation

## 2010-05-28 LAB — POCT I-STAT, CHEM 8
BUN: 14 mg/dL (ref 6–23)
Chloride: 96 mEq/L (ref 96–112)
Creatinine, Ser: 1.4 mg/dL (ref 0.4–1.5)
Glucose, Bld: 563 mg/dL (ref 70–99)
Potassium: 4.2 mEq/L (ref 3.5–5.1)
Sodium: 134 mEq/L — ABNORMAL LOW (ref 135–145)

## 2010-05-28 LAB — GLUCOSE, CAPILLARY
Glucose-Capillary: 369 mg/dL — ABNORMAL HIGH (ref 70–99)
Glucose-Capillary: 200 mg/dL — ABNORMAL HIGH (ref 70–99)

## 2010-06-26 NOTE — H&P (Signed)
NAME:  Timothy Heath, Timothy Heath              ACCOUNT NO.:  [id number]   MEDICAL RECORD NO.:  [id number]          PATIENT TYPE:  EMS   LOCATION:  ED                           FACILITY:  [facility]   PHYSICIAN:  Mobolaji B. Bakare, M.D.DATE OF BIRTH:  03/14/1975   DATE OF ADMISSION:  03/04/2004  DATE OF DISCHARGE:                                HISTORY & PHYSICAL   PRIMARY CARE PHYSICIAN:  Arelia Sneddon, M.D. at [facility].   CHIEF COMPLAINT:  Hypoglycemia.   HISTORY OF PRESENT ILLNESS:  The patient is a 35 year old -year-old African-  American male with a history of type 2 diabetes and obesity.  He was  diagnosed with diabetes mellitus in July 2004.  He is currently on a  combination fo Glucotrol 5 mg p.o. daily, Glucophage 500 mg p.o. b.i.d. and  Lantus 25 units q.h.s.  About one month ago his medications were increased.  The Glucophage was increased to b.i.d. from daily and Glucotrol was added  because he had an elevated blood glucose during an office visit.  In  addition, he is also on Lantus insulin 25 units q.h.s.  The patient was well  until about 5:00 a.m. today when he woke up feeling jittery and sweaty. He  checked his fingerstick blood glucose and it was 50. He drank some juice and  it went up to 75.  One hour later his symptoms recurred and he decided to  come over to the emergency department.  In the ER the initial blood glucose  was 88 and he was given a glucose drink and also IV D50.  His blood glucose  has been fluctuating despite the treatment.  At the time I was called his  blood glucose was 57.  On my arrival his blood glucose was 44.  At this time  the patient was asymptomatic.  The patient claims that he has been trying to  lose weight in the last four weeks and he has lost 12 pounds. His weight  checked four weeks ago was 322.  Weight in the emergency department was 310.  In addition, he has been exercising more than regularly in the last two  weeks.  He exercises for about  45 minutes in the morning and about one hour  in the evening. This has been consistent in the last two weeks. He currently  denies diaphoresis, chest pain or headaches although he did have  palpitations earlier this morning.   REVIEW OF SYMPTOMS:  GENERAL:  No fever.  CARDIOPULMONARY:  No shortness of  breath.  GI:  No diarrhea and no vomiting. His appetite is good.   PAST MEDICAL HISTORY:  1.  Diabetes mellitus diagnosed in 2004, July.  2.  Hypertension.  3.  Morbid obesity.  4.  History of azotemia secondary to dehydration.   PAST SURGICAL HISTORY:  1.  Appendectomy for acute appendicitis.  2.  Right knee surgery.  3.  Umbilical hernia repair.   MEDICATIONS:  1.  Prinivil 20 mg p.o. daily.  2.  Hydrochlorothiazide 25 mg p.o. daily.  3.  Diltiazem 120 mg p.o. daily.  4.  Glucotrol 5 mg p.o. daily.  5.  Glucophage 500 mg b.i.d.  6.  Lantus 25 units q.h.s.   ALLERGIES:  No known drug allergies.   SOCIAL HISTORY:  He is married and he has one child, a two-year-old son. He  works as a Financial risk analyst at [employer].   FAMILY HISTORY:  He has a positive family history of cardiac disease.  Mother had a MI at the age of 64.  A grandmother had a MI at the age between  75 and 62 and she also has congestive heart failure.  Father has  hypertension and renal insufficiency.   HABITS:  He does not smoke cigarettes and he does not drink alcohol.   PHYSICAL EXAMINATION:  INITIAL VITAL SIGNS ON ADMISSION:  Blood pressure  126/76, temperature 98.0, pulse 97, respiratory rate 20, 02 saturation 98%  on room air.  GENERAL:  He is a fairly obese patient not in any respiratory distress.  HEENT:  Normocephalic, atraumatic head.  Pupils equal, round and reactive to  light.  Extraocular muscle movement intact, anicteric and not pale.  Mucosal  membranes are moist.  LUNGS:  Clear anteriorly to auscultation.  CARDIOVASCULAR:  S1 and S2, regular with no appreciable murmur.  ABDOMEN:  Obese, soft and  non-tender.  No hepatosplenomegaly.  Bowel sounds  are present.  EXTREMITIES:  No pedal edema, no calf tenderness and no cyanosis.  NEUROLOGIC:  No focal neurological deficit.  SKIN:  No rash and no petechiae.   LABORATORY DATA:  Sodium 138, potassium 3.4, chloride 97, C02 21, glucose  50, BUN 11, creatinine 1.4, calcium 10.1.   ASSESSMENT AND PLAN:  1.  The patient is a 35 year old African-American male with a history of      type 2 diabetes mellitus on a combination of oral hypoglycemics and      Lantus insulin presenting with hypoglycemia which is not responding to      treatment so far; hence necessitating hospitalization and management.      The hypoglycemia is most likely secondary to a combination of increase      in his exercise, weight loss or multiple medications for managing his      diabetes mellitus.  I think at this point that he has managed to lose      some weight and he is highly motivated to continue to lose weight.  We      may need to cut back on his current medications, but in the interim with      his hypoglycemia we will admit him to telemetry, give IV fluids D10 at      100 mL per hour, check fingerstick blood glucose q. hour and then q.2h.      If blood glucose remains greater than 100 mg/dl times four values, then      it could be changed to q.4h as deemed appropriate.  I doubt this is due      to insulinoma; however, I will go ahead and check a pure insulin and      insulin levels because of the difficulty in getting his blood glucose      back to normal.  We will check a hemoglobin A1c to determine how      controlled his blood glucose has been.  2.  Hypokalemia.  We will give KCL 40 mEq p.o. times one now.  3.  Hypertension.  This is controlled and at this point we will continue      with  his home regimen of medications.  4.  Diabetes mellitus type 2.  All medications will be on hold for now.     These may need readjustment as necessary.  5.  Obesity. The  patient is highly motivated to lose weight and we should      encourage this.  We will check his fasting lipid profile and check his      urine for protein.  6.  Deep venous thrombosis prophylaxis.  We will use Lovenox 40 mg      subcutaneous daily.      MBB/MEDQ  D:  03/04/2004  T:  03/04/2004  Job:  [id number]   cc:   Arelia Sneddon  [address]  [place]  [place] [address]  Fax: [phone]

## 2010-06-26 NOTE — H&P (Signed)
[place]. [facility]  Patient:    Timothy Heath, Timothy Heath                     MRN: [id number] Adm. Date:  [id number] Attending:  Cherylynn Ridges                         History and Physical  IDENTIFICATION AND CHIEF COMPLAINT:  The patient is a 35 year old gentleman with right lower quadrant pain, nausea, vomiting and fevers, suspected to have acute  appendicitis.  HISTORY OF PRESENT ILLNESS:  Patient has had three days of diarrhea, periumbilical pain now localized to the right lower quadrant.  He has shake tenderness, cough  tenderness, tap tenderness, a positive Rovsing sign and rebound and guarding in the right lower quadrant with hypoactive bowel sounds.  His temperature in the ED was 101.9.  He has a normal white count but he does have a left shift and he has right lower quadrant tenderness.  PAST MEDICAL HISTORY:  Unremarkable.  PAST SURGICAL HISTORY:  He has had surgery on his knees arthroscopically and bilateral inguinal herniae.  MEDICATIONS:  He takes no medications.  ALLERGIES:  He has no known drug allergies.  REVIEW OF SYSTEMS:  He has had profuse diarrhea since then.  No constipation.  PHYSICAL EXAMINATION:  VITAL SIGNS:  He is afebrile.  His vital signs are otherwise stable.  He is mildly hypertensive at 150s/80s.  HEENT:  He is normocephalic, atraumatic and anicteric.  His pupils are equal, round and reactive to light.  TMs are clear.  NECK:  Supple.  CHEST:  Clear.  CARDIAC:  Regular rhythm and rate with no murmurs.  ABDOMEN:  He is distended.  Hypoactive bowel sounds.  He has a positive Rovsing  sign, right lower quadrant tenderness with rebound and guarding.  RECTAL:  He was tender in the right lower quadrant.  LABORATORY DATA:  He has a white count of 8400 but he does have a left shift.  IMPRESSION:  Possible acute appendicitis in an otherwise healthy 35 year old gentleman with right lower quadrant pain, tenderness  and fevers.  PLAN:  The plan is to perform a diagnostic laparoscopy with possible laparoscopic appendectomy.  It is a possibility this patient has ruptured with a normal-level white cell count.  We will plan on doing an appendectomy laparoscopically. DD:  04/02/99 TD:  04/02/99 Job: [id number]

## 2010-06-26 NOTE — Op Note (Signed)
[place]. [facility]  Patient:    Timothy Heath, Timothy Heath                     MRN: [id number] Proc. Date: 04/02/99 Adm. Date:  [id number] Attending:  Cherylynn Ridges                           Operative Report  PREOPERATIVE DIAGNOSIS:  Acute appendicitis.  POSTOPERATIVE DIAGNOSIS:  Early acute appendicitis with a retrocecal appendix.  PROCEDURE:  Laparoscopic appendectomy.  SURGEON:  Jimmye Norman, M.D.  ASSISTANT:  None.  ANESTHESIA:  General endotracheal.  ESTIMATED BLOOD LOSS:  Less than 20 cc.  COMPLICATIONS:  None.  CONDITION:  Stable.  INDICATION FOR OPERATION:  The patient is a 36 year old gentleman with right lower quadrant pain, tenderness, a normal white count but with a left shift, with rebound and guarding on the right side and a positive Rovsing sign, who is taken to the  operating room for a laparoscopic appendectomy.  FINDINGS:  He had a fat, edematous, thickened appendix with no evidence of perforation; he also had some adhesions from a previous umbilical hernia repair.  DESCRIPTION OF PROCEDURE:  The patient was taken to the operating room and placed on the table in a supine position.  After an adequate general anesthetic was administered, he was prepped and draped in the usual sterile manner, exposing the midline of the abdomen.  A #11 blade was used to make a supraumbilical incision down to the midline fascia and through this fascia, then a Veress needle was passed into the peritoneal cavity and confirmed to be in position using a saline test.  Subsequently, a 10/11-mm trocar and cannula were passed through the supraumbilical fascia into the peritoneal cavity and confirmed to be in intraperitoneal position using the laparoscope with attached camera and light source.  There were adhesions noted immediately of the omentum to the anterior abdominal wall which were taken down  with the scope itself and I managed to see the right  lower quadrant. Subsequently, a 5-mm right costal margin trocar was passed into the peritoneal cavity under direct vision.  The 11/12-mm cannula and trocar were passed through the suprapubic area into the peritoneal cavity under direct vision without injury to the bladder or to the peritoneal contents.  Once they were in, we were able to dissect out he appendix.  The patient was placed in Trendelenburg position.  We were able to dissect out he appendix, which was in a slight retrocecal position.  There was minimal exudate or no exudate and no fluid, but the appendix was fat and had injected vessels on its surface.  We were able to take the appendix between two Endo GIA stapling devices, a blue stapling device along the base of the appendix and then a white stapling  device along the mesoappendix, and then removing it through the suprapubic incision by pulling the appendix up into the cannula itself.  We did not use the cannula  again.  We irrigated with saline in the right lower quadrant and then closed. he supraumbilical fascia was closed using a figure-of-eight stitch of 0 Vicryl passed on a UR-6 needle and the skin was closed using running subcuticular stitch of 4-0 Vicryl.  Each incision site was injected with 0.25% Marcaine with epinephrine. All needle counts, sponge counts and instrument counts were correct at the conclusion of the case. DD:  04/02/99 TD:  04/02/99 Job: [id number]  [id number]

## 2010-06-26 NOTE — Discharge Summary (Signed)
NAME:  Timothy Heath, Timothy Heath NO.:  [id number]   MEDICAL RECORD NO.:  [id number]          PATIENT TYPE:  INP   LOCATION:  5157                         FACILITY:  [facility]   PHYSICIAN:  Chauncey Reading, D.O.  DATE OF BIRTH:  12-Sep-1975   DATE OF ADMISSION:  01/16/2008  DATE OF DISCHARGE:  01/18/2008                               DISCHARGE SUMMARY   DISCHARGE DIAGNOSES:  1. Diabetes mellitus type 2, untreated (A1c 14.1, December 2009).  2. Postconcussion headache.  3. Hypertension.  4. Acute renal failure, resolved.  5. Baseline creatinine per E-chart records 1.4.  6. Family history of end-stage renal disease.  7. History of proteinuria and increased creatinine as a teenager.  8. Status post knee arthroscopy.  9. Status post bilateral inguinal hernia repair.  10.Status post appendectomy, 2001.   DISCHARGE MEDICATIONS:  1. Lantus 25 units subcutaneously at bedtime.  Check your fasting blood sugar each morning before eating.  If after 3 days your blood sugar is higher than 180, add 4 units.  If after 3 days your blood sugar is between 120-180, add 2 units.  If after 3 days your blood sugar is less than 120, stop adjusting.  Do not increase if you have any blood sugars less than 72.  1. Lisinopril 2.5 mg p.o. daily.   DISCHARGE CONDITION AND FOLLOWUP:  The patient was discharged in stable  condition after his hyperglycemia had been corrected.  He was given  instructions on how to administer his own insulin and check his own  blood sugars.  He has done this in the past.  He felt comfortable with  the concept of titrating his Lantus dose based on his morning fasting  blood sugars and was given instructions on this.  He was able to  verbalize understanding of the plan for adjusting his Lantus.  He is to  follow up with Dr. Lucianne Muss, the telephone number is [phone].  Dr. Remus Blake  office was not answering the phone on the day of discharge, but the  patient will call them for an  appointment in the next couple weeks.  I  also gave him the number for the [facility] in case he  has any problems or finds himself running out of any of his medication.  At the time of his appointment, the following items need to be  addressed:  1. Diabetes:  The patient will bring a log of his blood sugars and his      current Lantus regimen.  He may be a candidate for using prandial      insulin to help control this A1c.  2. Renal function:  The patient should have a BMET to check his      creatinine.  His creatinine at the time of discharge was 1.11.      Given his size and muscular built, a creatinine of this level was      even slightly higher maybe consistent with adequate renal function      and it might be appropriate to consider starting metformin.  3. Hypertension:  The patient was found  to be hypertensive in the      emergency department; however, once he was admitted his blood      pressures ranged from 120s-140s.  He was therefore started on      lisinopril 2.5 mg for the renal protective effects of an ACE      inhibitor.  This dose may need to be titrated up assuming that his      renal function can tolerate it.  4. Hyperlipidemia:  The patient did not have a fasting lipid panel      obtained during this admission.  A lipid profile from 2006, showed      an LDL of 118, and it would be reasonable to discuss with the      patient whether a statin is appropriate.   PROCEDURES PERFORMED:  1. Noncontrast CT of the head dated January 17, 2008, showed no acute      intracranial pathology, no mass effect, no midline shift, or acute      intracranial hemorrhage.  2. Renal ultrasound dated January 18, 2008, showed no hydronephrosis      or calculi, no masses or cyst. Renal echotexture was normal.  The      renal size was normal.   BRIEF ADMITTING HISTORY AND PHYSICAL:  Timothy Heath is a 35 year old male  with untreated type 2 diabetes, hypertension, and history  of chronic  renal insufficiency who presented to the emergency room because he had  been experiencing frequent forehead headaches after he hit his head  while playing football several weeks earlier.  He has been having  headaches every other day and sometimes feels a throbbing sensation with  nausea but no vomiting, no photophobia, or osmophobia.  On the night  before admission, he had progressive onset of headache with blurry  vision (no diplopia), so he came to the emergency room.  He was noted in  ED to be hyperglycemic and explained that he had not taken his insulin  or metformin or glipizide in the year and a half because he had lost his  insurance when he lost his job.  He recently started a new job and had  an appointment scheduled for the day of admission.   PHYSICAL EXAMINATION:  VITAL SIGNS: Temperature 97.0, blood pressure  163/92, pulse 75, and oxygen saturation 98% room air.  GENERAL:  He was in no acute distress.  Pupils were equal, round, and  reactive to light.  Extraocular motions were intact.  NECK:  Supple without masses, no thyromegaly or carotid bruits.  LUNGS:  Clear to auscultation bilaterally with good air movement.  HEART:  Regular rate and rhythm with no murmurs, rubs, or gallops.  ABDOMEN:  Soft, nontender, and nondistended.  Bowel sounds were present  and there were no palpable masses or organomegaly.  EXTREMITIES:  No edema or cyanosis.  SKIN:  No lesions or rashes.  NEUROLOGIC:  He was alert and oriented x3.  Cranial nerves II through  XII were intact.  Strength was 5/5 throughout.  Sensation to light touch  was normal and symmetric.  Finger-to-nose was intact.   LABORATORY DATA:  Sodium 131, potassium 4.3, chloride 93, bicarbonate  28, BUN 14, creatinine 1.64, and glucose 579.  White blood count 8.1,  hemoglobin 14.5, and platelets 259.  Urinalysis showed yellow color,  clear appearance, specific gravity 1.028, pH 5.5, glucose greater than  1000,  bilirubin negative, ketones negative, blood negative, protein  negative, urobilinogen 0.2, nitrite negative, and leukocytes negative.  CT of the head was obtained with the results described above.   HOSPITAL COURSE:  1. Diabetes with hyperglycemia:  The patient was placed on an insulin      drip and his glucose was slowly corrected.  He was then      transitioned to Lantus 20 units on his first night in the hospital.      His blood sugar at the following morning was 329, so he was      discharged with instructions to begin taking Lantus at home at 25      units each night with instructions to adjust the dose as shown      above.  2. Postconcussion headache:  The patient's headache improved with      rest, Tylenol, and correction of his hyperglycemia.  He was advised      not to participate in contact sports for at least 6 weeks.  3. Acute versus chronic renal failure:  The patient's baseline      creatinine documented in e-chart is approximately 1.4.  His      creatinine on admission was 1.64, although this rapidly decreased      to 1.13 and then 1.11 on the morning of discharge.  Fractional      excretion of sodium was checked at the time of the second sample      was drawn and was found to be 1.25%, although it appears that the      patient was in fact prerenal when he initially presented.  Renal      ultrasound was obtained based on the patient's family history of      end-stage renal disease in several male family members on his      father's side to look for cysts or other obvious renal pathology      and none was found.  The patient's urinalysis was negative for      protein x2.  Certainly, however, given his diabetes, hypertension,      and family history, tight control of both his sugars and his blood      pressure will be important and the patient should be on an ACE      inhibitor.   DISCHARGE LABORATORY DATA AND VITALS:  Temperature 98.8, pulse 88,  respirations 20, blood  pressure 143/99, and oxygen saturation 96% on  room air.  Sodium 140, potassium 4.1, chloride 107, bicarbonate 25,  glucose 270, BUN 11, creatinine 1.11, calcium 9.4, and hemoglobin A1c  14.1.      Timothy Dubonnet, MD  Electronically Signed      Chauncey Reading, D.O.  Electronically Signed    PN/MEDQ  D:  01/19/2008  T:  01/20/2008  Job:  [id number]   cc:   Dr. Lucianne Muss

## 2010-06-26 NOTE — Discharge Summary (Signed)
[place]. [facility]  Patient:    Timothy Heath, Timothy Heath                     MRN: [id number] Adm. Date:  [id number] Disc. Date: 11/28/99 Attending:  Starr Sinclair Dictator:   Ilene Qua, M.D. CC:         Venita Lick. Pleas Koch., M.D. [facility]   Discharge Summary  DATE OF BIRTH:  14-Jun-1975  DISCHARGE DIAGNOSES: 1. Recurrent hematochezia. 2. Mild gastroesophageal reflux disease. 3. Status post appendectomy.  PROCEDURES:  None.  CONSULTATIONS:  Gastroenterology, Dr. Judie Petit. Russella Dar.  DISCHARGE MEDICATIONS: 1. Anusol suppositories two times a day. 2. Citrucel 1 tablespoon p.o. q.d.  ACTIVITY:  He should avoid heavy lifting or work until Monday after his procedure.  DIET:  No restrictions.  SPECIAL INSTRUCTIONS:  He is to report to the short-stay unit on November 30, 1999, Monday, at 9:30 a.m. for a flexible sigmoidoscopy with Dr. Russella Dar.  He is to be n.p.o. after midnight on Sunday, and he is to take two Fleet enemas at 6 a.m. and 8 a.m. on Monday.  FOLLOW-UP:  He was instructed also to call [phone] to schedule a follow-up appointment with myself, Dr. Drue Second, at the family practice center in the next one to two weeks.  HISTORY OF PRESENT ILLNESS:  The patient is a 35 year old black male presenting to the emergency department complaining of bright red blood per rectum x 2.  He reported drinking some juice at about 4 p.m. that later left a bad taste in his mouth.  He felt sick after that, with some epigastric abdominal pain and then had a small blood-streaked BM about 5 oclock that evening, followed by a small amount of bright red blood.  At 2100, he had another bowel movement that was only blood in the toilet.  He denies any clots or melena.  He continues to have some mild epigastric pain and now some diffuse abdominal pain, weakness, and lightheadedness.  The patient does admit to some occasional blood-streaked stools in the past but has  never been worked up for this.  PHYSICAL EXAMINATION:  VITAL SIGNS:  On admission blood pressure was 134/78, temperature 98.4, saturation 98% on room air, pulse of 87.  Orthostatics were normal.  GENERAL:  Comfortable, heavy African-American male, pleasant, in no acute distress.  HEENT:  NG aspirate was clear mucous.  No acute occult blood.  CARDIOVASCULAR:  Regular rate and rhythm.  No murmur, rub, or gallop.  Pulses were 2+ bilaterally.  ABDOMEN:  Obese but soft.  Some positive tenderness in the right upper quadrant and epigastrium and also in the right mid quadrant to right lower quadrant, but no guarding, and he had normoactive bowel sounds.  LUNGS:  Clear to auscultation.  RECTAL:  Normal external sphincter and had positive soft brown stool in the vault and was heme-positive but had no palpable hemorrhoids.  Rectal anoscopy was unable to be performed secondary to not being able to locate anoscope.  ADMISSION LABORATORY DATA:  White count 11.1, hemoglobin 15.2, platelet count 288.  UA was negative.  Urine drug screen was negative.  PT was 13.4, INR was 1.1.  HOSPITAL COURSE: #1 - GASTROINTESTINAL BLEED:  This was felt most likely to be secondary to anorectal source, and we wanted to check an anoscopy exam but were unable to locate an anoscope in the hospital, but we also considered an infectious colitis source or an upper GI source.  Gastroenterology was consulted as well for this recurrent hematochezia, and they recommended ruling out hemorrhoids, colitis, or proctitis and other causes with a flexible sigmoidoscopy that they felt would be appropriate to do as an outpatient in the [facility] Unit on Monday morning.  This was scheduled prior to his discharge and set up for Monday morning at 9:30 a.m.  He was given instructions for the prep for this and, over the course of his hospital stay, he had no further bloody bowel movements but were just  blood-tinged.  His hemoglobin did fall from 15.2 to 14.1.  That was after hydration as well, and he was not feeling lightheaded or dizzy at the time of discharge.  #2 - MILD GASTROESOPHAGEAL REFLUX:  It was recommended by gastroenterology that the patient start just using over-the-counter antacids for this and could be followed up on an outpatient basis.  DISPOSITION:  The patient was discharged to his home, with follow-up planned on Monday.  CONDITION ON DISCHARGE:  Stable. DD:  11/28/99 TD:  11/30/99 Job: [id number]

## 2010-06-26 NOTE — Discharge Summary (Signed)
NAME:  Timothy Heath, Timothy Heath                        ACCOUNT NO.:  [id number]   MEDICAL RECORD NO.:  [id number]                   PATIENT TYPE:  INP   LOCATION:  0364                                 FACILITY:  [facility]   PHYSICIAN:  Deirdre Peer. Polite, M.D.              DATE OF BIRTH:  1975-05-16   DATE OF ADMISSION:  08/19/2002  DATE OF DISCHARGE:  08/20/2002                                 DISCHARGE SUMMARY   DISCHARGE DIAGNOSES:  1. Newly diagnosed diabetes mellitus, type 2.  Hemoglobin A1C of 11.1.  2. Hypertension.  3. Morbid obesity.  4. Azotemia secondary to dehydration, resolved.   DISCHARGE MEDICATIONS:  1. Glyburide 10 mg one p.o. b.i.d.  2. Glucophage 500 mg one p.o. b.i.d. to be titrated to 1 gm b.i.d.  3. Lantus 16 units q.h.s.  4. Altace 2.5 mg one daily.   CONSULTANTS:  None.   STUDIES:  Hemoglobin A1C 11.1.  BMET on admission was significant for a  creatinine of 1.9.  Creatinine on discharge 1.4.  No acidosis or anion gap.  CBG on presentation greater than 600.   HISTORY OF PRESENT ILLNESS:  A 35 year old black male who presented to the  ED with complaints of dizziness and weakness.  Evaluation in the ED found  the patient to have a CBG of greater than 600.  ABG with pH of 7.4.  Bicarb  on BMET of 24.  Admission was deemed necessary for further evaluation and  treatment.  Newly diagnosed with diabetes.   PAST MEDICAL HISTORY:  As stated above.   MEDICATIONS ON ADMISSION:  None.   SOCIAL HISTORY:  Negative for tobacco.  Social alcohol.  No drugs.   ALLERGIES:  No known drug allergies.   FAMILY HISTORY:  Significant for diabetes in his mother.  Father with  hypertension.  Brother is deceased secondary to suicide.   HOSPITAL COURSE:  The patient was admitted to a nursing floor bed for  evaluation and treatment of newly diagnosed diabetic.  It was felt to be  type 2 diabetes as there was no acidosis.  The patient was started on an  insulin drip, judicious IV fluids  which was ultimately converted to Lantus  and oral hypoglycemics, Glyburide/Glucophage.  The patient's sugars are  still elevated at this time.  I would expect it to slowly trend into a  normal range.  The patient has been seen in consultation by a case  management social worker to assist with outpatient followup.  The patient  has also been given diabetic education in the hospital in reference to  administering insulin and checking the blood sugars.  The patient has been  encouraged to make lifestyle modifications (i.e. exercise 4 to 5 times a  week, and a  diabetic diet).  He will also need outpatient diabetic education.  The  patient had other tests ordered.  A TSH was within normal limits, and a BMET  within normal limits at the time of discharge.  At this time, the patient is  medically stable for discharge to home with outpatient follow up of his  diabetes.                                               Deirdre Peer. Polite, M.D.    RDP/MEDQ  D:  08/20/2002  T:  08/20/2002  Job:  [id number]

## 2010-08-17 ENCOUNTER — Inpatient Hospital Stay (INDEPENDENT_AMBULATORY_CARE_PROVIDER_SITE_OTHER)
Admission: RE | Admit: 2010-08-17 | Discharge: 2010-08-17 | Disposition: A | Discharge status: Home / Self Care | Payer: Self-pay | Source: Ambulatory Visit

## 2010-08-17 DIAGNOSIS — K047 Periapical abscess without sinus: Secondary | ICD-10-CM

## 2010-09-02 ENCOUNTER — Emergency Department (HOSPITAL_COMMUNITY)
Admission: EM | Admit: 2010-09-02 | Discharge: 2010-09-03 | Disposition: A | Discharge status: Home / Self Care | Payer: Self-pay | Attending: Emergency Medicine | Admitting: Emergency Medicine

## 2010-09-02 DIAGNOSIS — L270 Generalized skin eruption due to drugs and medicaments taken internally: Secondary | ICD-10-CM | POA: Insufficient documentation

## 2010-09-02 DIAGNOSIS — T360X5A Adverse effect of penicillins, initial encounter: Secondary | ICD-10-CM | POA: Insufficient documentation

## 2010-09-02 DIAGNOSIS — IMO0001 Reserved for inherently not codable concepts without codable children: Secondary | ICD-10-CM | POA: Insufficient documentation

## 2010-09-02 DIAGNOSIS — L2989 Other pruritus: Secondary | ICD-10-CM | POA: Insufficient documentation

## 2010-09-02 DIAGNOSIS — E119 Type 2 diabetes mellitus without complications: Secondary | ICD-10-CM | POA: Insufficient documentation

## 2010-09-02 DIAGNOSIS — R062 Wheezing: Secondary | ICD-10-CM | POA: Insufficient documentation

## 2010-09-02 DIAGNOSIS — R509 Fever, unspecified: Secondary | ICD-10-CM | POA: Insufficient documentation

## 2010-09-02 DIAGNOSIS — L298 Other pruritus: Secondary | ICD-10-CM | POA: Insufficient documentation

## 2010-09-02 DIAGNOSIS — K029 Dental caries, unspecified: Secondary | ICD-10-CM | POA: Insufficient documentation

## 2010-09-04 ENCOUNTER — Inpatient Hospital Stay (INDEPENDENT_AMBULATORY_CARE_PROVIDER_SITE_OTHER)
Admission: RE | Admit: 2010-09-04 | Discharge: 2010-09-04 | Disposition: A | Discharge status: Home / Self Care | Payer: Self-pay | Source: Ambulatory Visit | Attending: Emergency Medicine | Admitting: Emergency Medicine

## 2010-09-04 DIAGNOSIS — E119 Type 2 diabetes mellitus without complications: Secondary | ICD-10-CM

## 2010-09-04 DIAGNOSIS — K047 Periapical abscess without sinus: Secondary | ICD-10-CM

## 2010-09-04 LAB — POCT I-STAT, CHEM 8
Creatinine, Ser: 1.4 mg/dL — ABNORMAL HIGH (ref 0.50–1.35)
HCT: 48 % (ref 39.0–52.0)
Hemoglobin: 16.3 g/dL (ref 13.0–17.0)
Potassium: 4.2 mEq/L (ref 3.5–5.1)
Sodium: 136 mEq/L (ref 135–145)
TCO2: 26 mmol/L (ref 0–100)

## 2010-11-03 LAB — DIFFERENTIAL
Basophils Relative: 1
Eosinophils Absolute: 0.1
Monocytes Absolute: 0.7
Monocytes Relative: 9

## 2010-11-03 LAB — BASIC METABOLIC PANEL
BUN: 13
Calcium: 8.4
Creatinine, Ser: 1.24
GFR calc non Af Amer: >60
Glucose, Bld: 384 — ABNORMAL HIGH

## 2010-11-03 LAB — URINALYSIS, ROUTINE W REFLEX MICROSCOPIC
Bilirubin Urine: NEGATIVE
Hgb urine dipstick: NEGATIVE
Nitrite: NEGATIVE
Protein, ur: NEGATIVE
Urobilinogen, UA: 1

## 2010-11-03 LAB — CBC
Hemoglobin: 14.5
MCHC: 33.4
MCV: 77.3 — ABNORMAL LOW
RBC: 5.62
RDW: 13.5

## 2010-11-03 LAB — POCT I-STAT, CHEM 8
Chloride: 96
Glucose, Bld: 642
HCT: 53 — ABNORMAL HIGH
Hemoglobin: 18 — ABNORMAL HIGH
Potassium: 4.5
Sodium: 129 — ABNORMAL LOW

## 2010-11-03 LAB — URINE MICROSCOPIC-ADD ON

## 2010-11-05 LAB — DIFFERENTIAL
Basophils Absolute: 0
Eosinophils Absolute: 0.2
Lymphocytes Relative: 20
Neutro Abs: 5.8
Neutrophils Relative %: 71

## 2010-11-05 LAB — URINE MICROSCOPIC-ADD ON

## 2010-11-05 LAB — BASIC METABOLIC PANEL
BUN: 13
Chloride: 97
GFR calc Af Amer: >60
Potassium: 4.7

## 2010-11-05 LAB — URINALYSIS, ROUTINE W REFLEX MICROSCOPIC
Glucose, UA: >1000 — AB
Hgb urine dipstick: NEGATIVE
Protein, ur: NEGATIVE

## 2010-11-05 LAB — POCT I-STAT, CHEM 8
BUN: 14
Chloride: 99
Creatinine, Ser: 1.3
Potassium: 4.3
Sodium: 132 — ABNORMAL LOW

## 2010-11-05 LAB — CBC
Platelets: 246
RDW: 13.8

## 2010-11-05 LAB — URINE CULTURE: Colony Count: 4000

## 2010-11-11 LAB — GLUCOSE, CAPILLARY: Glucose-Capillary: 501

## 2010-11-13 LAB — GLUCOSE, CAPILLARY
Glucose-Capillary: 158 mg/dL — ABNORMAL HIGH (ref 70–99)
Glucose-Capillary: 164 mg/dL — ABNORMAL HIGH (ref 70–99)
Glucose-Capillary: 164 mg/dL — ABNORMAL HIGH (ref 70–99)
Glucose-Capillary: 245 mg/dL — ABNORMAL HIGH (ref 70–99)
Glucose-Capillary: 262 mg/dL — ABNORMAL HIGH (ref 70–99)
Glucose-Capillary: 277 mg/dL — ABNORMAL HIGH (ref 70–99)
Glucose-Capillary: 570 mg/dL (ref 70–99)

## 2010-11-13 LAB — BASIC METABOLIC PANEL
BUN: 11 mg/dL (ref 6–23)
BUN: 14 mg/dL (ref 6–23)
CO2: 27 mEq/L (ref 19–32)
Calcium: 9.2 mg/dL (ref 8.4–10.5)
Calcium: 9.4 mg/dL (ref 8.4–10.5)
Chloride: 106 mEq/L (ref 96–112)
Creatinine, Ser: 1.13 mg/dL (ref 0.4–1.5)
GFR calc Af Amer: 60 mL/min — ABNORMAL LOW (ref >60)
GFR calc Af Amer: >60 mL/min (ref >60)
GFR calc non Af Amer: 50 mL/min — ABNORMAL LOW (ref >60)
GFR calc non Af Amer: >60 mL/min (ref >60)
Glucose, Bld: 270 mg/dL — ABNORMAL HIGH (ref 70–99)
Sodium: 131 mEq/L — ABNORMAL LOW (ref 135–145)
Sodium: 140 mEq/L (ref 135–145)

## 2010-11-13 LAB — CREATININE, URINE, RANDOM: Creatinine, Urine: 46.1 mg/dL

## 2010-11-13 LAB — URINALYSIS, ROUTINE W REFLEX MICROSCOPIC
Bilirubin Urine: NEGATIVE
Glucose, UA: >1000 mg/dL — AB
Hgb urine dipstick: NEGATIVE
Ketones, ur: NEGATIVE mg/dL
Leukocytes, UA: NEGATIVE
Nitrite: NEGATIVE
Protein, ur: NEGATIVE mg/dL
Specific Gravity, Urine: 1.028 (ref 1.005–1.030)
pH: 5.5 (ref 5.0–8.0)
pH: 6.5 (ref 5.0–8.0)

## 2010-11-13 LAB — DIFFERENTIAL
Basophils Absolute: 0 10*3/uL (ref 0.0–0.1)
Lymphocytes Relative: 32 % (ref 12–46)
Neutro Abs: 4.6 10*3/uL (ref 1.7–7.7)
Neutrophils Relative %: 57 % (ref 43–77)

## 2010-11-13 LAB — RAPID URINE DRUG SCREEN, HOSP PERFORMED
Amphetamines: NOT DETECTED
Tetrahydrocannabinol: NOT DETECTED

## 2010-11-13 LAB — CBC
Platelets: 259 10*3/uL (ref 150–400)
RDW: 13.7 % (ref 11.5–15.5)

## 2010-11-13 LAB — HEPATIC FUNCTION PANEL
Albumin: 3.5 g/dL (ref 3.5–5.2)
Alkaline Phosphatase: 65 U/L (ref 39–117)
Total Bilirubin: 0.8 mg/dL (ref 0.3–1.2)

## 2010-11-13 LAB — PHOSPHORUS: Phosphorus: 3 mg/dL (ref 2.3–4.6)

## 2010-11-13 LAB — URINE MICROSCOPIC-ADD ON

## 2010-11-13 LAB — HEMOGLOBIN A1C: Hgb A1c MFr Bld: 14.1 % — ABNORMAL HIGH (ref 4.6–6.1)

## 2010-11-16 LAB — URINALYSIS, ROUTINE W REFLEX MICROSCOPIC
Bilirubin Urine: NEGATIVE
Hgb urine dipstick: NEGATIVE
Specific Gravity, Urine: 1.025
pH: 5.5

## 2010-11-24 LAB — I-STAT 8, (EC8 V) (CONVERTED LAB)
BUN: 16
Chloride: 102
HCT: 50
Hemoglobin: 17
Operator id: 151321
Sodium: 138

## 2010-11-24 LAB — POCT I-STAT CREATININE
Creatinine, Ser: 1.3
Operator id: 151321

## 2011-03-09 ENCOUNTER — Emergency Department (INDEPENDENT_AMBULATORY_CARE_PROVIDER_SITE_OTHER)
Admission: EM | Admit: 2011-03-09 | Discharge: 2011-03-09 | Disposition: A | Discharge status: Home / Self Care | Payer: Self-pay | Source: Home / Self Care | Attending: Family Medicine | Admitting: Family Medicine

## 2011-03-09 ENCOUNTER — Encounter (HOSPITAL_COMMUNITY): Payer: Self-pay | Admitting: *Deleted

## 2011-03-09 DIAGNOSIS — S0300XA Dislocation of jaw, unspecified side, initial encounter: Secondary | ICD-10-CM

## 2011-03-09 HISTORY — DX: Essential (primary) hypertension: I10

## 2011-03-09 MED ORDER — MELOXICAM 15 MG PO TABS: 15.0000 mg | ORAL_TABLET | Freq: Every day | ORAL | Status: DC

## 2011-03-09 NOTE — ED Notes (Signed)
Pt  Has  Pain     r  Side  Face    Upper  Jaw  Area        Symptoms  Off an  On  For  About 1  Month      He  Reports  He  Grinds  His  Teeth a  Lot       He  denys  Any specefic  injury

## 2011-03-09 NOTE — ED Provider Notes (Signed)
Timothy Heath is a 36 y.o. male who presents to Urgent Care today for one month of right jaw popping and pain. He notes that he wakes up in the morning with clenched teeth and has pain when he chews food..  he denies any tooth pain. He feels well otherwise. He forgot to take his blood pressure medicine this morning.   PMH reviewed.  ROS as above otherwise neg Medications reviewed. No current facility-administered medications for this encounter.   Current Outpatient Prescriptions  Medication Sig Dispense Refill  . glipiZIDE (GLUCOTROL) 5 MG tablet Take 5 mg by mouth 2 (two) times daily before a meal.      . lisinopril (PRINIVIL,ZESTRIL) 10 MG tablet Take 10 mg by mouth daily.      . metFORMIN (GLUCOPHAGE) 500 MG tablet Take 500 mg by mouth 2 (two) times daily with a meal.      . meloxicam (MOBIC) 15 MG tablet Take 1 tablet (15 mg total) by mouth daily.  14 tablet  1    Exam:  BP 145/101  Pulse 92  Temp(Src) 99.2 F (37.3 C) (Oral)  Resp 16  SpO2 98% Gen: Well NAD HEENT: EOMI,  MMM, tenderness to palpation in the right TMJ with opening jaw No cavities noted  Assessment and Plan: 36 year old male with right TMJ pain. Plan to prescribe meloxicam as needed for pain and encourage patient to followup with an oral surgeon. Handout provided. He expresses understanding.     Clementeen Graham, MD 03/09/11 [phone]

## 2011-03-10 NOTE — ED Provider Notes (Signed)
Medical screening examination/treatment/procedure(s) were performed by PGY-3 FM resident and as supervising physician I was immediately available for consultation/collaboration.   Sharin Grave, MD   Sharin Grave, MD 03/10/11 [phone]

## 2011-04-05 ENCOUNTER — Emergency Department (HOSPITAL_COMMUNITY)
Admission: EM | Admit: 2011-04-05 | Discharge: 2011-04-05 | Disposition: A | Discharge status: Home / Self Care | Payer: Self-pay | Attending: Emergency Medicine | Admitting: Emergency Medicine

## 2011-04-05 ENCOUNTER — Encounter (HOSPITAL_COMMUNITY): Payer: Self-pay | Admitting: *Deleted

## 2011-04-05 DIAGNOSIS — H53149 Visual discomfort, unspecified: Secondary | ICD-10-CM | POA: Insufficient documentation

## 2011-04-05 DIAGNOSIS — R110 Nausea: Secondary | ICD-10-CM | POA: Insufficient documentation

## 2011-04-05 DIAGNOSIS — I10 Essential (primary) hypertension: Secondary | ICD-10-CM | POA: Insufficient documentation

## 2011-04-05 DIAGNOSIS — Z79899 Other long term (current) drug therapy: Secondary | ICD-10-CM | POA: Insufficient documentation

## 2011-04-05 DIAGNOSIS — E119 Type 2 diabetes mellitus without complications: Secondary | ICD-10-CM | POA: Insufficient documentation

## 2011-04-05 DIAGNOSIS — R51 Headache: Secondary | ICD-10-CM | POA: Insufficient documentation

## 2011-04-05 DIAGNOSIS — R739 Hyperglycemia, unspecified: Secondary | ICD-10-CM

## 2011-04-05 LAB — GLUCOSE, CAPILLARY: Glucose-Capillary: 237 mg/dL — ABNORMAL HIGH (ref 70–99)

## 2011-04-05 MED ORDER — METOCLOPRAMIDE HCL 10 MG PO TABS
10.0000 mg | ORAL_TABLET | Freq: Once | ORAL | Status: AC
  Administered 2011-04-05: 10 mg via ORAL
  Filled 2011-04-05: qty 1

## 2011-04-05 MED ORDER — SODIUM CHLORIDE 0.9 % IV BOLUS (SEPSIS)
1000.0000 mL | Freq: Once | INTRAVENOUS | Status: AC
  Administered 2011-04-05: 1000 mL via INTRAVENOUS

## 2011-04-05 MED ORDER — OXYCODONE-ACETAMINOPHEN 5-325 MG PO TABS
1.0000 | ORAL_TABLET | Freq: Once | ORAL | Status: AC
  Administered 2011-04-05: 1 via ORAL
  Filled 2011-04-05: qty 1

## 2011-04-05 MED ORDER — INSULIN ASPART 100 UNIT/ML ~~LOC~~ SOLN
10.0000 [IU] | Freq: Once | SUBCUTANEOUS | Status: AC
  Administered 2011-04-05: 10 [IU] via SUBCUTANEOUS
  Filled 2011-04-05: qty 1

## 2011-04-05 MED ORDER — IBUPROFEN 200 MG PO TABS
600.0000 mg | ORAL_TABLET | Freq: Once | ORAL | Status: AC
  Administered 2011-04-05: 600 mg via ORAL
  Filled 2011-04-05 (×2): qty 3

## 2011-04-05 NOTE — ED Notes (Signed)
CBG resulted 390

## 2011-04-05 NOTE — ED Notes (Addendum)
HA for two days now, rating his HA on scale of 8/10. Pt stated took some Excedrin and it did not help. Stated some nausea but no vomiting.

## 2011-04-05 NOTE — ED Provider Notes (Addendum)
History     CSN: [id number]  Arrival date & time 04/05/11  0258   First MD Initiated Contact with Patient 04/05/11 [phone]      Chief Complaint  Patient presents with  . Headache    (Consider location/radiation/quality/duration/timing/severity/associated sxs/prior treatment) HPI Pt with poorly controlled DM p/w HA x 2 days. Pt states he has had similar HA in the past. It is R sided and associated with photophobia and nausea. No improvement with OTC meds. No focal weakness or sensory loss. No fever or nuchal rigidity.  Past Medical History  Diagnosis Date  . Hypertension   . Diabetes mellitus     Past Surgical History  Procedure Date  . Appendectomy   . Knee surgery     No family history on file.  History  Substance Use Topics  . Smoking status: Never Smoker   . Smokeless tobacco: Not on file  . Alcohol Use: No      Review of Systems  Constitutional: Negative for fever and chills.  HENT: Negative for neck pain and neck stiffness.   Respiratory: Negative for chest tightness and shortness of breath.   Cardiovascular: Negative for chest pain, palpitations and leg swelling.  Gastrointestinal: Positive for nausea. Negative for vomiting, abdominal pain and diarrhea.  Musculoskeletal: Negative for back pain.  Neurological: Positive for headaches. Negative for dizziness, weakness and numbness.    Allergies  Bee and Penicillins  Home Medications   Current Outpatient Rx  Name Route Sig Dispense Refill  . ACETAMINOPHEN 500 MG PO TABS Oral Take 500 mg by mouth every 6 (six) hours as needed.    . ASPIRIN-ACETAMINOPHEN-CAFFEINE 250-250-65 MG PO TABS Oral Take 1 tablet by mouth every 6 (six) hours as needed.    Marland Kitchen GLIPIZIDE 5 MG PO TABS Oral Take 5 mg by mouth every morning.     Marland Kitchen LISINOPRIL 10 MG PO TABS Oral Take 10 mg by mouth daily.    Marland Kitchen METFORMIN HCL 500 MG PO TABS Oral Take 500 mg by mouth 2 (two) times daily with a meal.      BP 141/85  Pulse 96  Temp 98.1 F  (36.7 C)  Resp 18  SpO2 97%  Physical Exam  Nursing note and vitals reviewed. Constitutional: He is oriented to person, place, and time. He appears well-developed and well-nourished. No distress.  HENT:  Head: Normocephalic and atraumatic.  Mouth/Throat: Oropharynx is clear and moist.  Eyes: EOM are normal. Pupils are equal, round, and reactive to light.  Neck: Normal range of motion. Neck supple.       No nuchal rigidity  Cardiovascular: Normal rate and regular rhythm.   Pulmonary/Chest: Effort normal and breath sounds normal. No respiratory distress. He has no wheezes. He has no rales.  Abdominal: Soft. Bowel sounds are normal.  Musculoskeletal: Normal range of motion. He exhibits no edema and no tenderness.  Neurological: He is alert and oriented to person, place, and time. No cranial nerve deficit.       5/5 motor, sensation intact.   Skin: Skin is warm and dry. No rash noted. No erythema.  Psychiatric: He has a normal mood and affect. His behavior is normal.    ED Course  Procedures (including critical care time)  Labs Reviewed  GLUCOSE, CAPILLARY - Abnormal; Notable for the following:    Glucose-Capillary 390 (*)    All other components within normal limits  GLUCOSE, CAPILLARY - Abnormal; Notable for the following:    Glucose-Capillary 237 (*)  All other components within normal limits   No results found.   1. Hyperglycemia   2. HA (headache)       MDM   Pt states he is feeling much better. D/C home f/u with PMD.        Loren Racer, MD 04/05/11 Jeralyn Bennett  Loren Racer, MD 04/05/11 [phone]

## 2011-04-05 NOTE — ED Notes (Signed)
Pt dc home, stated much relief with pain med, dc instruction explained and given to pt, stated understanding. Zero needs voiced.

## 2011-04-05 NOTE — ED Notes (Signed)
The pt has had a headache for 2 days.  Over-the counter meds is not helping.  The pt is a diabetic and was not taking his meds for approx 6 months then he started back for 2-3 months

## 2011-05-06 ENCOUNTER — Emergency Department (HOSPITAL_BASED_OUTPATIENT_CLINIC_OR_DEPARTMENT_OTHER)
Admission: EM | Admit: 2011-05-06 | Discharge: 2011-05-06 | Disposition: A | Discharge status: Home / Self Care | Payer: Worker's Compensation | Attending: Emergency Medicine | Admitting: Emergency Medicine

## 2011-05-06 ENCOUNTER — Encounter (HOSPITAL_BASED_OUTPATIENT_CLINIC_OR_DEPARTMENT_OTHER): Payer: Self-pay | Admitting: *Deleted

## 2011-05-06 DIAGNOSIS — Z79899 Other long term (current) drug therapy: Secondary | ICD-10-CM | POA: Insufficient documentation

## 2011-05-06 DIAGNOSIS — I10 Essential (primary) hypertension: Secondary | ICD-10-CM | POA: Insufficient documentation

## 2011-05-06 DIAGNOSIS — E119 Type 2 diabetes mellitus without complications: Secondary | ICD-10-CM | POA: Insufficient documentation

## 2011-05-06 DIAGNOSIS — H10219 Acute toxic conjunctivitis, unspecified eye: Secondary | ICD-10-CM | POA: Insufficient documentation

## 2011-05-06 MED ORDER — LISINOPRIL 10 MG PO TABS: 10.0000 mg | ORAL_TABLET | Freq: Once | ORAL | Status: DC

## 2011-05-06 MED ORDER — HYDROCHLOROTHIAZIDE 25 MG PO TABS: 25.0000 mg | ORAL_TABLET | Freq: Every day | ORAL | Status: DC

## 2011-05-06 MED ORDER — SODIUM CHLORIDE 0.9 % IR SOLN
1000.0000 mL | Freq: Once | Status: DC
  Filled 2011-05-06: qty 1000

## 2011-05-06 MED ORDER — LISINOPRIL 10 MG PO TABS
ORAL_TABLET | ORAL | Status: AC
  Filled 2011-05-06: qty 1

## 2011-05-06 MED ORDER — HYDROCHLOROTHIAZIDE 25 MG PO TABS
ORAL_TABLET | ORAL | Status: AC
  Filled 2011-05-06: qty 1

## 2011-05-06 NOTE — ED Notes (Signed)
Visual acuity checked, ou 20/30, os 20/30, od 20/40, pt states  "I feel much better", denies any pain.

## 2011-05-06 NOTE — ED Provider Notes (Signed)
History     CSN: [id number]  Arrival date & time 05/06/11  1359   First MD Initiated Contact with Patient 05/06/11 1418      Chief Complaint  Patient presents with  . Eye Problem    (Consider location/radiation/quality/duration/timing/severity/associated sxs/prior treatment) HPI Patient is a 36 yo male who was at work when a small amount of commercial bleach splashed into his right eye.  He used 2 bottles of eye wash there prior to arrival.  The patient described 10/10 right eye pain initially.  By the time of arrival pain was 6/10.  Pain was worse with light.  Patient denied any other associated injuries or nausea.  He was not wearing contacts when this occurred. He does note blurry vision when his eye is open.There are no other associated or modifying factors.  Past Medical History  Diagnosis Date  . Hypertension   . Diabetes mellitus     Past Surgical History  Procedure Date  . Appendectomy   . Knee surgery     History reviewed. No pertinent family history.  History  Substance Use Topics  . Smoking status: Never Smoker   . Smokeless tobacco: Not on file  . Alcohol Use: No      Review of Systems  Constitutional: Negative.   HENT: Negative.   Eyes: Positive for photophobia, pain, redness and visual disturbance.  Respiratory: Negative.   Cardiovascular: Negative.   Gastrointestinal: Negative.   Genitourinary: Negative.   Musculoskeletal: Negative.   Skin: Negative.   Neurological: Negative.   Hematological: Negative.   Psychiatric/Behavioral: Negative.   All other systems reviewed and are negative.    Allergies  Bee and Penicillins  Home Medications   Current Outpatient Rx  Name Route Sig Dispense Refill  . ACETAMINOPHEN 500 MG PO TABS Oral Take 500 mg by mouth every 6 (six) hours as needed. For pain.    . ASPIRIN-ACETAMINOPHEN-CAFFEINE 250-250-65 MG PO TABS Oral Take 1 tablet by mouth every 6 (six) hours as needed. For headaches.    Marland Kitchen GLIPIZIDE 5 MG  PO TABS Oral Take 5 mg by mouth every morning.     Marland Kitchen LISINOPRIL 10 MG PO TABS Oral Take 10 mg by mouth daily.    Marland Kitchen METFORMIN HCL 500 MG PO TABS Oral Take 500 mg by mouth 2 (two) times daily with a meal.      BP 136/85  Pulse 104  Temp 98.5 F (36.9 C)  Resp 18  SpO2 100%  Physical Exam  Nursing note and vitals reviewed. GEN: Well-developed, well-nourished male who is uncomfortable appearing HEENT: Atraumatic, normocephalic.  EYES: PERRLA, scleral injection in the right eye with significant photophobia, visual acuity is R 20/200 compared to L  20/25  initially NECK: Trachea midline, no meningismus CV: regular rate and rhythm.  PULM: No respiratory distress.   MSK: Patient moves all 4 extremities symmetrically, no deformity, edema, or injury noted Skin: No rashes petechiae, purpura, or jaundice Psych: no abnormality of mood   ED Course  Procedures (including critical care time)  Labs Reviewed - No data to display No results found.   1. Chemical insult, eye       MDM  Patient was started on irrigation of the eye with morgan lens and saline.  He declined offer of pain medication and was not nauseas.  Patient had recheck of pH following 1 L and was 6.0. Vision was improved on reassessment but still blurry and pain was 3/10.  After final iter pH was  8 and visual acuity was restored.  Patient was discharged in good condition with referral to Dr. Allyne Gee from optho as needed.        Cyndra Numbers, MD 05/07/11 [phone]

## 2011-05-06 NOTE — ED Notes (Signed)
Visual Acuity right eye 20/200 left eye 20/25.

## 2011-05-06 NOTE — ED Notes (Signed)
Was at work and got bleach splashed in his right  eye. Has washed it out with 2 bottles of eye wash from work. Burning sensation.

## 2011-05-06 NOTE — ED Notes (Signed)
Visual acuity checked, ou 20/20, os 20/50, od 20/40

## 2011-05-06 NOTE — ED Notes (Signed)
Ph checked in right eye, 6.0, dr. Alto Denver requests another liter of ns irrigation by morgan lens, verbal order. Pt tolerated well.

## 2011-05-06 NOTE — ED Notes (Signed)
Ph checked to right eye, now 8.0. Dr. Alto Denver states no more irrigation needed. Pt alert and cooperative in nad.

## 2011-05-06 NOTE — ED Notes (Signed)
right eye irrigated with 2,000 ns using morgan lens. Unable to locate order in epic, verbal order from dr. Alto Denver, pt tolerated well.

## 2011-08-26 ENCOUNTER — Emergency Department (HOSPITAL_COMMUNITY)
Admission: EM | Admit: 2011-08-26 | Discharge: 2011-08-26 | Disposition: A | Discharge status: Home / Self Care | Payer: Self-pay | Attending: Emergency Medicine | Admitting: Emergency Medicine

## 2011-08-26 ENCOUNTER — Encounter (HOSPITAL_COMMUNITY): Payer: Self-pay | Admitting: *Deleted

## 2011-08-26 ENCOUNTER — Emergency Department (HOSPITAL_COMMUNITY): Payer: Self-pay

## 2011-08-26 DIAGNOSIS — Z9089 Acquired absence of other organs: Secondary | ICD-10-CM | POA: Insufficient documentation

## 2011-08-26 DIAGNOSIS — R51 Headache: Secondary | ICD-10-CM | POA: Insufficient documentation

## 2011-08-26 DIAGNOSIS — E119 Type 2 diabetes mellitus without complications: Secondary | ICD-10-CM | POA: Insufficient documentation

## 2011-08-26 DIAGNOSIS — I10 Essential (primary) hypertension: Secondary | ICD-10-CM | POA: Insufficient documentation

## 2011-08-26 DIAGNOSIS — Z794 Long term (current) use of insulin: Secondary | ICD-10-CM | POA: Insufficient documentation

## 2011-08-26 LAB — GLUCOSE, CAPILLARY

## 2011-08-26 LAB — POCT I-STAT, CHEM 8
BUN: 12 mg/dL (ref 6–23)
Chloride: 99 mEq/L (ref 96–112)
HCT: 47 % (ref 39.0–52.0)
Sodium: 138 mEq/L (ref 135–145)

## 2011-08-26 MED ORDER — SODIUM CHLORIDE 0.9 % IV BOLUS (SEPSIS)
1000.0000 mL | Freq: Once | INTRAVENOUS | Status: AC
  Administered 2011-08-26: 1000 mL via INTRAVENOUS

## 2011-08-26 MED ORDER — GLIPIZIDE 5 MG PO TABS: 5.0000 mg | ORAL_TABLET | Freq: Every day | ORAL | Status: DC

## 2011-08-26 MED ORDER — MORPHINE SULFATE 4 MG/ML IJ SOLN
4.0000 mg | Freq: Once | INTRAMUSCULAR | Status: AC
  Administered 2011-08-26: 4 mg via INTRAVENOUS
  Filled 2011-08-26: qty 1

## 2011-08-26 MED ORDER — METFORMIN HCL 500 MG PO TABS: 500.0000 mg | ORAL_TABLET | Freq: Two times a day (BID) | ORAL | Status: DC

## 2011-08-26 MED ORDER — INSULIN ASPART 100 UNIT/ML ~~LOC~~ SOLN
4.0000 [IU] | Freq: Once | SUBCUTANEOUS | Status: AC
  Administered 2011-08-26: 4 [IU] via INTRAVENOUS
  Filled 2011-08-26: qty 1

## 2011-08-26 MED ORDER — ONDANSETRON HCL 4 MG/2ML IJ SOLN
4.0000 mg | Freq: Once | INTRAMUSCULAR | Status: AC
  Administered 2011-08-26: 4 mg via INTRAVENOUS
  Filled 2011-08-26: qty 2

## 2011-08-26 NOTE — ED Provider Notes (Addendum)
History     CSN: [id number]  Arrival date & time 08/26/11  0001   First MD Initiated Contact with Patient 08/26/11 0107      Chief Complaint  Patient presents with  . Migraine    (Consider location/radiation/quality/duration/timing/severity/associated sxs/prior treatment) Patient is a 36 y.o. male presenting with migraine. The history is provided by the patient.  Migraine This is a new problem. The current episode started more than 2 days ago. The problem occurs constantly. The problem has not changed since onset.Associated symptoms include headaches. Nothing aggravates the symptoms. Nothing relieves the symptoms. He has tried ASA for the symptoms. The treatment provided no relief.    Past Medical History  Diagnosis Date  . Hypertension   . Diabetes mellitus     Past Surgical History  Procedure Date  . Appendectomy   . Knee surgery   . Hernia repair     History reviewed. No pertinent family history.  History  Substance Use Topics  . Smoking status: Never Smoker   . Smokeless tobacco: Not on file  . Alcohol Use: No      Review of Systems  Neurological: Positive for headaches.  All other systems reviewed and are negative.    Allergies  Other and Penicillins  Home Medications   Current Outpatient Rx  Name Route Sig Dispense Refill  . ACETAMINOPHEN 500 MG PO TABS Oral Take 500 mg by mouth every 6 (six) hours as needed. For pain.    Marland Kitchen GLIPIZIDE 5 MG PO TABS Oral Take 5 mg by mouth every morning.     Marland Kitchen HYDROCODONE-ACETAMINOPHEN PO Oral Take 1 tablet by mouth once. For dental pain    . INSULIN GLARGINE 100 UNIT/ML [facility] SOLN Subcutaneous Inject 10 Units into the skin at bedtime.    Marland Kitchen METFORMIN HCL 500 MG PO TABS Oral Take 500 mg by mouth 2 (two) times daily with a meal.      BP 126/83  Pulse 108  Temp 98.4 F (36.9 C) (Oral)  SpO2 97%  Physical Exam  Constitutional: He is oriented to person, place, and time. He appears well-developed and well-nourished.    HENT:  Head: Normocephalic and atraumatic.  Eyes: Conjunctivae are normal. Pupils are equal, round, and reactive to light.  Neck: Normal range of motion. Neck supple.       No meningismus  Cardiovascular: Normal rate, regular rhythm, normal heart sounds and intact distal pulses.   Pulmonary/Chest: Effort normal and breath sounds normal.  Abdominal: Soft. Bowel sounds are normal.  Neurological: He is alert and oriented to person, place, and time.  Skin: Skin is warm and dry.  Psychiatric: He has a normal mood and affect. His behavior is normal. Judgment and thought content normal.    ED Course  Procedures (including critical care time)  Labs Reviewed  GLUCOSE, CAPILLARY - Abnormal; Notable for the following:    Glucose-Capillary 312 (*)     All other components within normal limits   No results found.   No diagnosis found.    MDM  Pt with hx of diabetes,  Out of glipizide 5 and metformin 500 bid x 2 wks.  Now with ha,  No fever, no menigismus.  Elevated cbg.  Will treat,  Ct head,  Reassess.   Improved. NO dka.  Ct head neg.  Will refill meds ret new/worsening sxs     Wandalene Abrams Lytle Michaels, MD 08/26/11 [id number]  Rosanne Ashing, MD 08/26/11 [phone]

## 2011-08-26 NOTE — ED Notes (Signed)
Pt c/o frontal HA for the past 3 days.  States it hurts behind his eyes, has taken Tylenol and Hydrocodone with no relief

## 2011-09-20 ENCOUNTER — Emergency Department (HOSPITAL_COMMUNITY)
Admission: EM | Admit: 2011-09-20 | Discharge: 2011-09-20 | Disposition: A | Discharge status: Home / Self Care | Payer: Self-pay | Attending: Emergency Medicine | Admitting: Emergency Medicine

## 2011-09-20 ENCOUNTER — Emergency Department (INDEPENDENT_AMBULATORY_CARE_PROVIDER_SITE_OTHER)
Admission: EM | Admit: 2011-09-20 | Discharge: 2011-09-20 | Disposition: A | Discharge status: Other Acute Inpatient Hospital | Payer: Self-pay | Source: Home / Self Care | Attending: Emergency Medicine | Admitting: Emergency Medicine

## 2011-09-20 ENCOUNTER — Encounter (HOSPITAL_COMMUNITY): Payer: Self-pay

## 2011-09-20 ENCOUNTER — Encounter (HOSPITAL_COMMUNITY): Payer: Self-pay | Admitting: Emergency Medicine

## 2011-09-20 DIAGNOSIS — Z9089 Acquired absence of other organs: Secondary | ICD-10-CM | POA: Insufficient documentation

## 2011-09-20 DIAGNOSIS — Z794 Long term (current) use of insulin: Secondary | ICD-10-CM | POA: Insufficient documentation

## 2011-09-20 DIAGNOSIS — I10 Essential (primary) hypertension: Secondary | ICD-10-CM | POA: Insufficient documentation

## 2011-09-20 DIAGNOSIS — E119 Type 2 diabetes mellitus without complications: Secondary | ICD-10-CM

## 2011-09-20 DIAGNOSIS — E1169 Type 2 diabetes mellitus with other specified complication: Secondary | ICD-10-CM | POA: Insufficient documentation

## 2011-09-20 LAB — URINALYSIS, ROUTINE W REFLEX MICROSCOPIC
Bilirubin Urine: NEGATIVE
Ketones, ur: NEGATIVE mg/dL
Nitrite: NEGATIVE
Protein, ur: NEGATIVE mg/dL
Urobilinogen, UA: 0.2 mg/dL (ref 0.0–1.0)

## 2011-09-20 LAB — COMPREHENSIVE METABOLIC PANEL
ALT: 14 U/L (ref 0–53)
Alkaline Phosphatase: 60 U/L (ref 39–117)
BUN: 10 mg/dL (ref 6–23)
CO2: 25 mEq/L (ref 19–32)
GFR calc Af Amer: >90 mL/min (ref >90)
GFR calc non Af Amer: 84 mL/min — ABNORMAL LOW (ref >90)
Glucose, Bld: 406 mg/dL — ABNORMAL HIGH (ref 70–99)
Potassium: 4.2 mEq/L (ref 3.5–5.1)
Sodium: 135 mEq/L (ref 135–145)
Total Bilirubin: 0.4 mg/dL (ref 0.3–1.2)
Total Protein: 7 g/dL (ref 6.0–8.3)

## 2011-09-20 LAB — CBC WITH DIFFERENTIAL/PLATELET
Eosinophils Absolute: 0.1 10*3/uL (ref 0.0–0.7)
Hemoglobin: 14.4 g/dL (ref 13.0–17.0)
Lymphocytes Relative: 36 % (ref 12–46)
Lymphs Abs: 2.6 10*3/uL (ref 0.7–4.0)
MCH: 26.5 pg (ref 26.0–34.0)
MCV: 77.7 fL — ABNORMAL LOW (ref 78.0–100.0)
Monocytes Relative: 6 % (ref 3–12)
Neutrophils Relative %: 55 % (ref 43–77)
Platelets: 232 10*3/uL (ref 150–400)
RBC: 5.43 MIL/uL (ref 4.22–5.81)
WBC: 7.2 10*3/uL (ref 4.0–10.5)

## 2011-09-20 LAB — GLUCOSE, CAPILLARY
Glucose-Capillary: 225 mg/dL — ABNORMAL HIGH (ref 70–99)
Glucose-Capillary: 223 mg/dL — ABNORMAL HIGH (ref 70–99)

## 2011-09-20 LAB — URINE MICROSCOPIC-ADD ON

## 2011-09-20 LAB — POCT I-STAT, CHEM 8
BUN: 10 mg/dL (ref 6–23)
Creatinine, Ser: 1.2 mg/dL (ref 0.50–1.35)
Hemoglobin: 15.6 g/dL (ref 13.0–17.0)
Potassium: 4.1 mEq/L (ref 3.5–5.1)
Sodium: 137 mEq/L (ref 135–145)

## 2011-09-20 MED ORDER — INSULIN ASPART 100 UNIT/ML ~~LOC~~ SOLN
5.0000 [IU] | Freq: Once | SUBCUTANEOUS | Status: AC
  Administered 2011-09-20: 100 [IU] via INTRAVENOUS

## 2011-09-20 MED ORDER — SODIUM CHLORIDE 0.9 % IV BOLUS (SEPSIS)
1000.0000 mL | Freq: Once | INTRAVENOUS | Status: AC
  Administered 2011-09-20: 1000 mL via INTRAVENOUS

## 2011-09-20 MED ORDER — INSULIN ASPART 100 UNIT/ML ~~LOC~~ SOLN
10.0000 [IU] | Freq: Once | SUBCUTANEOUS | Status: DC
  Filled 2011-09-20: qty 1

## 2011-09-20 NOTE — ED Notes (Signed)
State she has had Dx of DM for several years; "every time I get sick, I go to the ER, and they write a new Rx for me; they told me I could come here for treatment" State he has never had a regular MD for his DM

## 2011-09-20 NOTE — ED Notes (Signed)
Pt sent from [facility] for hyperglycemia; pt sts taking meds; pt sts some fatigue and increased thirst

## 2011-09-20 NOTE — ED Notes (Signed)
Reports recent lethargy. (Denies: any other sx; denies: pain, sob, nvd, fever, blurred vision, sweating, dizziness, HA or other sx).

## 2011-09-20 NOTE — ED Provider Notes (Signed)
Chief Complaint  Patient presents with  . Diabetes    History of Present Illness:  The patient is a 36 year old male with a three-year history of diabetes. He was getting care in [place] where he lived, but then moved here and didn't have insurance, so he didn't have a primary care Dr. He was first seen at the emergency room about a year ago to get refills on his meds and thereafter has come back every couple of months to get his medicines refilled. His current medications include glipizide 5 mg daily and metformin 500 mg twice a day. He had been on Lantus insulin 10 units at bedtime but for some reason stopped this. Recently even with medications his blood sugars have ranged from 180-260. He denies any polydipsia but has had some polyuria. No blurry vision or dry mouth. He denies any hypoglycemic episodes. His weight has been stable. He does not have a primary care physician right now. He denies any abdominal pain, nausea, or vomiting. He hasn't had any fevers.  Review of Systems:  Other than noted above, the patient denies any of the following symptoms. Systemic:  No fever, chills, fatigue, weight loss or gain. Eye:  No blurred vision or diplopia. Lungs:  No cough, wheezing, or shortness of breath. Heart:  No chest pain, tightness, pressure, palpitation, dizziness, syncope, or edema. Abdomen:  No abdominal pain, nausea, vomiting or diarrrhea. GU:  No dysuria, frequency, urgency, hematuria. Ext:  No pain, paresthesias, swelling, or ulcerations. Endocrine:  No polyuria, polydipsia, heat or cold intolerance. Skin:  No rash or itching. Neuro:  No focal weakness or numbness.   PMFSH:  Past medical history, family history, social history, meds, and allergies were reviewed.  Physical Exam:   Vital signs:  BP 151/96  Pulse 102  Temp 98.7 F (37.1 C) (Oral)  Resp 18  SpO2 98% Gen:  Alert, oriented, in no distress. Eye:  PERRL, full EOM, lids conjunctivas, and sclera unremarkable. ENT:   TMs and canals normal.  Mucous membranes moist.  No acetone odor.  Pharynx clear.   Neck:  Supple, full ROM, no adenopathy or tenderness.  No JVD. Lungs:  Clear to auscultation.  No wheezes, rales or rhonchi. Heart:  Regular rhythm.  No gallops or murmers. Abdomen:  Soft, flat, non-distended, nontener.  No hepato-splenomegaly or mass.  Bowel sounds normal.  No pulsatile midline mass or bruit. Ext:  No edema, pulses full.  No ulceration or skin lesions. Skin:  Clear, warm and dry.  No rash or lesions. Neuro:  Alert and oriented times 3.  No focal weakness.  Speech normal.  CNs intact.  Labs:   Results for orders placed during the hospital encounter of 09/20/11  POCT I-STAT, CHEM 8      Component Value Range   Sodium 137  135 - 145 mEq/L   Potassium 4.1  3.5 - 5.1 mEq/L   Chloride 99  96 - 112 mEq/L   BUN 10  6 - 23 mg/dL   Creatinine, Ser 8.29  0.50 - 1.35 mg/dL   Glucose, Bld 562 (*) 70 - 99 mg/dL   Calcium, Ion 1.30 (*) 1.12 - 1.23 mmol/L   TCO2 24  0 - 100 mmol/L   Hemoglobin 15.6  13.0 - 17.0 g/dL   HCT 86.5  78.4 - 69.6 %    Assessment:  The encounter diagnosis was DM. This is poorly controlled even with medication. Since he doesn't have any means of followup, am reluctant to send him  home, so I'm sending him to the emergency department. I'm thinking he may need to start on insulin, since oral medications clearly are controlling his blood sugars.  Plan:   1.  The following meds were prescribed:   New Prescriptions   No medications on file   2.  The patient was transferred to the emergency department via shuttle.  Reuben Likes, MD 09/20/11 1534

## 2011-09-20 NOTE — ED Notes (Signed)
Alert, NAD, calm, skin W&D, resps e/u, amiable, interactive, "feels better", denies pain sob nausea dizziness blurred vision or other sx, out to d/c desk with wife, given work note and referrals.

## 2011-09-21 NOTE — ED Provider Notes (Signed)
History     CSN: [id number]  Arrival date & time 09/20/11  1522   First MD Initiated Contact with Patient 09/20/11 2013      Chief Complaint  Patient presents with  . Hyperglycemia    (Consider location/radiation/quality/duration/timing/severity/associated sxs/prior treatment) HPI The patient is a 36 year old male with a three-year history of diabetes. He was getting care in [place] where he lived, but then moved here and didn't have insurance, so he didn't have a primary care Dr. He was first seen at the emergency room about a year ago to get refills on his meds and thereafter has come back every couple of months to get his medicines refilled. His current medications include glipizide 5 mg daily and metformin 500 mg twice a day. He had discussed starting Lantus with his PCP previously but never started this. Recently even with medications his blood sugars have ranged from 180-260. He denies any polydipsia but has had some polyuria. No blurry vision or dry mouth. He denies any hypoglycemic episodes. His weight has been stable. He does not have a primary care physician right now. He denies any abdominal pain, nausea, or vomiting. He hasn't had any fevers.  He was seen at [facility] this morning and transferred here for hyperglycemia. He had a blood sugar in the low 400s while there. Pt states that he ate some fruit right before going to [facility] today.   Past Medical History  Diagnosis Date  . Hypertension   . Diabetes mellitus     Past Surgical History  Procedure Date  . Appendectomy   . Knee surgery   . Hernia repair     History reviewed. No pertinent family history.  History  Substance Use Topics  . Smoking status: Never Smoker   . Smokeless tobacco: Not on file  . Alcohol Use: No      Review of Systems  Constitutional: Negative for fever, chills and fatigue.  HENT: Negative for congestion and sore throat.   Eyes: Negative for photophobia and visual disturbance.    Respiratory: Negative for cough and shortness of breath.   Cardiovascular: Negative for chest pain and palpitations.  Gastrointestinal: Negative for nausea, vomiting and abdominal pain.  Genitourinary: Positive for frequency.  Musculoskeletal: Negative for myalgias.  Skin: Negative for color change and rash.  Neurological: Negative for dizziness and weakness.    Allergies  Other and Penicillins  Home Medications   Current Outpatient Rx  Name Route Sig Dispense Refill  . ACETAMINOPHEN 500 MG PO TABS Oral Take 500 mg by mouth every 6 (six) hours as needed. For pain.    Marland Kitchen GLIPIZIDE 5 MG PO TABS Oral Take 5 mg by mouth every morning.     . INSULIN GLARGINE 100 UNIT/ML [place] SOLN Subcutaneous Inject 10 Units into the skin at bedtime.    Marland Kitchen METFORMIN HCL 500 MG PO TABS Oral Take 1 tablet (500 mg total) by mouth 2 (two) times daily with a meal. 60 tablet 3    BP 144/99  Pulse 108  Temp 97.7 F (36.5 C) (Oral)  Resp 20  SpO2 98%  Physical Exam  Nursing note and vitals reviewed. Constitutional: He appears well-developed and well-nourished. No distress.  HENT:  Head: Normocephalic and atraumatic.  Mouth/Throat: Oropharynx is clear and moist. No oropharyngeal exudate.  Eyes:       Normal appearance  Neck: Normal range of motion.  Cardiovascular: Normal rate, regular rhythm and normal heart sounds.   Pulmonary/Chest: Effort normal and breath sounds normal.  He exhibits no tenderness.  Abdominal: Soft. Bowel sounds are normal. There is no tenderness. There is no rebound and no guarding.  Musculoskeletal: Normal range of motion.  Neurological: He is alert.  Skin: Skin is warm and dry. He is not diaphoretic.  Psychiatric: He has a normal mood and affect.    ED Course  Procedures (including critical care time)  Labs Reviewed  CBC WITH DIFFERENTIAL - Abnormal; Notable for the following:    MCV 77.7 (*)     All other components within normal limits  COMPREHENSIVE METABOLIC PANEL -  Abnormal; Notable for the following:    Glucose, Bld 406 (*)     GFR calc non Af Amer 84 (*)     All other components within normal limits  URINALYSIS, ROUTINE W REFLEX MICROSCOPIC - Abnormal; Notable for the following:    Specific Gravity, Urine 1.035 (*)     Glucose, UA >1000 (*)     Leukocytes, UA TRACE (*)     All other components within normal limits  GLUCOSE, CAPILLARY - Abnormal; Notable for the following:    Glucose-Capillary 296 (*)     All other components within normal limits  URINE MICROSCOPIC-ADD ON - Abnormal; Notable for the following:    Squamous Epithelial / LPF FEW (*)     Casts HYALINE CASTS (*)     All other components within normal limits  GLUCOSE, CAPILLARY - Abnormal; Notable for the following:    Glucose-Capillary 225 (*)     All other components within normal limits  GLUCOSE, CAPILLARY - Abnormal; Notable for the following:    Glucose-Capillary 223 (*)     All other components within normal limits  LAB REPORT - SCANNED   No results found.   1. DM       MDM  Pt presents with elevated blood glucose today. No ketones in urine or electrolyte imbalance to indicate DKA at this time. Pt's blood glucose was rechecked after being in the lobby for several hours prior to getting a bed and was 296. Came down further with IVF. Will have pt increase dose of metformin to 1000 mg am, 500 mg pm and check BGs at home. Would prefer to defer starting insulin to PCP. Referral given to [facility].  Reasons to return discussed.      Grant Fontana, [place] 09/21/11 1908

## 2011-09-23 NOTE — ED Provider Notes (Signed)
Medical screening examination/treatment/procedure(s) were performed by non-physician practitioner and as supervising physician I was immediately available for consultation/collaboration.  Carrolyn Hilmes, MD 09/23/11 0739 

## 2012-02-04 ENCOUNTER — Other Ambulatory Visit: Payer: Self-pay | Admitting: Family Medicine

## 2012-02-27 ENCOUNTER — Ambulatory Visit (INDEPENDENT_AMBULATORY_CARE_PROVIDER_SITE_OTHER): Payer: 59 | Admitting: Family Medicine

## 2012-02-27 VITALS — BP 147/88 | HR 97 | Temp 98.7°F | Resp 18 | Ht 73.0 in | Wt 256.0 lb

## 2012-02-27 DIAGNOSIS — IMO0001 Reserved for inherently not codable concepts without codable children: Secondary | ICD-10-CM

## 2012-02-27 DIAGNOSIS — E1165 Type 2 diabetes mellitus with hyperglycemia: Secondary | ICD-10-CM

## 2012-02-27 LAB — LIPID PANEL
Cholesterol: 234 mg/dL — ABNORMAL HIGH (ref 0–200)
HDL: 35 mg/dL — ABNORMAL LOW (ref >39)
LDL Cholesterol: 137 mg/dL — ABNORMAL HIGH (ref 0–99)
Total CHOL/HDL Ratio: 6.7 Ratio
Triglycerides: 309 mg/dL — ABNORMAL HIGH (ref <150)
VLDL: 62 mg/dL — ABNORMAL HIGH (ref 0–40)

## 2012-02-27 LAB — COMPREHENSIVE METABOLIC PANEL
ALT: 16 U/L (ref 0–53)
AST: 15 U/L (ref 0–37)
Albumin: 4.3 g/dL (ref 3.5–5.2)
Alkaline Phosphatase: 68 U/L (ref 39–117)
BUN: 14 mg/dL (ref 6–23)
CO2: 28 mEq/L (ref 19–32)
Calcium: 9.6 mg/dL (ref 8.4–10.5)
Chloride: 93 mEq/L — ABNORMAL LOW (ref 96–112)
Creat: 1.31 mg/dL (ref 0.50–1.35)
Glucose, Bld: 410 mg/dL — ABNORMAL HIGH (ref 70–99)
Potassium: 4.2 mEq/L (ref 3.5–5.3)
Sodium: 132 mEq/L — ABNORMAL LOW (ref 135–145)
Total Bilirubin: 0.6 mg/dL (ref 0.3–1.2)
Total Protein: 7.3 g/dL (ref 6.0–8.3)

## 2012-02-27 LAB — POCT GLYCOSYLATED HEMOGLOBIN (HGB A1C): Hemoglobin A1C: >=14

## 2012-02-27 LAB — GLUCOSE, POCT (MANUAL RESULT ENTRY): POC Glucose: 415 mg/dl — AB (ref 70–99)

## 2012-02-27 MED ORDER — LIRAGLUTIDE 18 MG/3ML ~~LOC~~ SOLN: 0.6000 mL | Freq: Every day | SUBCUTANEOUS | Status: DC

## 2012-02-27 MED ORDER — METFORMIN HCL 1000 MG PO TABS: 1000.0000 mg | ORAL_TABLET | Freq: Two times a day (BID) | ORAL | Status: DC

## 2012-02-27 NOTE — Progress Notes (Signed)
Patient ID: KEIL PICKERING MRN: [id number], DOB: 13-Mar-1975, 37 y.o. Date of Encounter: 02/27/2012, 2:00 PM  Primary Physician: Default, Provider, MD  Chief Complaint: Diabetes follow up  HPI: 37 y.o. year old male with history below presents for follow up of diabetes mellitus. Doing well. No issues or complaints. Taking medications daily without adverse effects. No polydipsia, polyphagia, polyuria, or nocturia.  Blood sugars at home:  unknown Diet consists of:  Watching sweets Exercising regularly. With son Last A1C: unsure (2009 it was 14.1) Eye MD:  2 years ag DDS:  Last year  Influenza vaccine:  Over a year ago Pneumococcal vaccine: never   Past Medical History  Diagnosis Date  . Hypertension   . Diabetes mellitus   . Allergy   . Asthma      Home Meds: Prior to Admission medications   Medication Sig Start Date End Date Taking? Authorizing Provider  acetaminophen (TYLENOL) 500 MG tablet Take 500 mg by mouth every 6 (six) hours as needed. For pain.    Historical Provider, MD  glipiZIDE (GLUCOTROL) 5 MG tablet Take 5 mg by mouth every morning.     Historical Provider, MD  insulin glargine (LANTUS) 100 UNIT/ML injection Inject 10 Units into the skin at bedtime.    Historical Provider, MD  metFORMIN (GLUCOPHAGE) 500 MG tablet Take 1 tablet (500 mg total) by mouth 2 (two) times daily with a meal. 08/26/11 08/25/12  Chionesu Lytle Michaels, MD    Allergies:  Allergies  Allergen Reactions  . Other Other (See Comments)    Allergic to bees anaphalaxis  . Penicillins Other (See Comments)    Fever and shortness of breath    History   Social History  . Marital Status: Divorced    Spouse Name: N/A    Number of Children: N/A  . Years of Education: N/A   Occupational History  . Not on file.   Social History Main Topics  . Smoking status: Never Smoker   . Smokeless tobacco: Not on file  . Alcohol Use: Yes  . Drug Use: No  . Sexually Active:    Other Topics Concern  .  Not on file   Social History Narrative  . No narrative on file     Review of Systems: Constitutional: negative for chills, fever, night sweats, weight changes, or fatigue  HEENT: negative for vision changes, hearing loss, congestion, rhinorrhea, or epistaxis Cardiovascular: negative for chest pain, palpitations, diaphoresis, DOE, orthopnea, or edema Respiratory: negative for hemoptysis, wheezing, shortness of breath, dyspnea, or cough Abdominal: negative for abdominal pain, nausea, vomiting, diarrhea, or constipation Dermatological: negative for rash, erythema, or wounds Neurologic: negative for headache, dizziness, or syncope Renal:  Negative for polyuria, polydipsia, or dysuria All other systems reviewed and are otherwise negative with the exception to those above and in the HPI.   Physical Exam: Blood pressure 147/88, pulse 97, temperature 98.7 F (37.1 C), temperature source Oral, resp. rate 18, height 6\' 1"  (1.854 m), weight 256 lb (116.121 kg), SpO2 97.00%., Body mass index is 33.78 kg/(m^2). General: Well developed, well nourished, in no acute distress. Head: Normocephalic, atraumatic, eyes without discharge, sclera non-icteric, nares are without discharge. Bilateral auditory canals clear, TM's are without perforation, pearly grey and translucent with reflective cone of light bilaterally. Oral cavity moist, posterior pharynx without exudate, erythema, peritonsillar abscess, or post nasal drip.  Neck: Supple. No thyromegaly. Full ROM. No lymphadenopathy. Lungs: Clear bilaterally to auscultation without wheezes, rales, or rhonchi. Breathing is unlabored. Heart:  RRR with S1 S2. No murmurs, rubs, or gallops appreciated. Abdomen: Soft, non-tender, non-distended with normoactive bowel sounds. No hepatosplenomegaly. No rebound/guarding. No obvious abdominal masses. Msk:  Strength and tone normal for age. Extremities/Skin: Warm and dry. No clubbing or cyanosis. No edema. No rashes, wounds,  or suspicious lesions. Monofilament exam unremarkable bilaterally.  Neuro: Alert and oriented X 3. Moves all extremities spontaneously. Gait is normal. CNII-XII grossly in tact. Psych:  Responds to questions appropriately with a normal affect.   Labs: Results for orders placed in visit on 02/27/12  GLUCOSE, POCT (MANUAL RESULT ENTRY)      Component Value Range   POC Glucose 415 (*) 70 - 99 mg/dl  POCT GLYCOSYLATED HEMOGLOBIN (HGB A1C)      Component Value Range   Hemoglobin A1C >=14.0      ASSESSMENT AND PLAN:  37 y.o. year old male with uncontrolled diabetes, type 2.  He is not symptomatic at present but will require close followup. 1. Diabetes type 2, uncontrolled  POCT glucose (manual entry), POCT glycosylated hemoglobin (Hb A1C), Comprehensive metabolic panel, Lipid panel, Microalbumin, urine, metFORMIN (GLUCOPHAGE) 1000 MG tablet, Liraglutide SOLN 3.6 mg   Follow up 24 hours. -  Signed, Elvina Sidle, MD 02/27/2012 2:00 PM

## 2012-02-27 NOTE — Patient Instructions (Addendum)
Please return tomorrow.  The glucose is much too high and we will be adjusting doses of medicine this week as glucose comes under control   Diabetes and Exercise Regular exercise is important and can help:   Control blood glucose (sugar).  Decrease blood pressure.    Control blood lipids (cholesterol, triglycerides).  Improve overall health. BENEFITS FROM EXERCISE  Improved fitness.  Improved flexibility.  Improved endurance.  Increased bone density.  Weight control.  Increased muscle strength.  Decreased body fat.  Improvement of the body's use of insulin, a hormone.  Increased insulin sensitivity.  Reduction of insulin needs.  Reduced stress and tension.  Helps you feel better. People with diabetes who add exercise to their lifestyle gain additional benefits, including:  Weight loss.  Reduced appetite.  Improvement of the body's use of blood glucose.  Decreased risk factors for heart disease:  Lowering of cholesterol and triglycerides.  Raising the level of good cholesterol (high-density lipoproteins, HDL).  Lowering blood sugar.  Decreased blood pressure. TYPE 1 DIABETES AND EXERCISE  Exercise will usually lower your blood glucose.  If blood glucose is greater than 240 mg/dl, check urine ketones. If ketones are present, do not exercise.  Location of the insulin injection sites may need to be adjusted with exercise. Avoid injecting insulin into areas of the body that will be exercised. For example, avoid injecting insulin into:  The arms when playing tennis.  The legs when jogging. For more information, discuss this with your caregiver.  Keep a record of:  Food intake.  Type and amount of exercise.  Expected peak times of insulin action.  Blood glucose levels. Do this before, during, and after exercise. Review your records with your caregiver. This will help you to develop guidelines for adjusting food intake and insulin amounts.  TYPE  2 DIABETES AND EXERCISE  Regular physical activity can help control blood glucose.  Exercise is important because it may:  Increase the body's sensitivity to insulin.  Improve blood glucose control.  Exercise reduces the risk of heart disease. It decreases serum cholesterol and triglycerides. It also lowers blood pressure.  Those who take insulin or oral hypoglycemic agents should watch for signs of hypoglycemia. These signs include dizziness, shaking, sweating, chills, and confusion.  Body water is lost during exercise. It must be replaced. This will help to avoid loss of body fluids (dehydration) or heat stroke. Be sure to talk to your caregiver before starting an exercise program to make sure it is safe for you. Remember, any activity is better than none.  Document Released: 04/17/2003 Document Revised: 04/19/2011 Document Reviewed: 08/01/2008 [facility] Patient Information 2013 [place], [place]. Diabetes and Foot Care Diabetes may cause you to have a poor blood supply (circulation) to your legs and feet. Because of this, the skin may be thinner, break easier, and heal more slowly. You also may have nerve damage in your legs and feet causing decreased feeling. You may not notice minor injuries to your feet that could lead to serious problems or infections. Taking care of your feet is one of the most important things you can do for yourself.  HOME CARE INSTRUCTIONS  Do not go barefoot. Bare feet are easily injured.  Check your feet daily for blisters, cuts, and redness.  Wash your feet with warm water (not hot) and mild soap. Pat your feet and between your toes until completely dry.  Apply a moisturizing lotion that does not contain alcohol or petroleum jelly to the dry skin on  your feet and to dry brittle toenails. Do not put it between your toes.  Trim your toenails straight across. Do not dig under them or around the cuticle.  Do not cut corns or calluses, or try to remove them with  medicine.  Wear clean cotton socks or stockings every day. Make sure they are not too tight. Do not wear knee high stockings since they may decrease blood flow to your legs.  Wear leather shoes that fit properly and have enough cushioning. To break in new shoes, wear them just a few hours a day to avoid injuring your feet.  Wear shoes at all times, even in the house.  Do not cross your legs. This may decrease the blood flow to your feet.  If you find a minor scrape, cut, or break in the skin on your feet, keep it and the skin around it clean and dry. These areas may be cleansed with mild soap and water. Do not use peroxide, alcohol, iodine or Merthiolate.  When you remove an adhesive bandage, be sure not to harm the skin around it.  If you have a wound, look at it several times a day to make sure it is healing.  Do not use heating pads or hot water bottles. Burns can occur. If you have lost feeling in your feet or legs, you may not know it is happening until it is too late.  Report any cuts, sores or bruises to your caregiver. Do not wait! SEEK MEDICAL CARE IF:   You have an injury that is not healing or you notice redness, numbness, burning, or tingling.  Your feet always feel cold.  You have pain or cramps in your legs and feet. SEEK IMMEDIATE MEDICAL CARE IF:   There is increasing redness, swelling, or increasing pain in the wound.  There is a red line that goes up your leg.  Pus is coming from a wound.  You develop an unexplained oral temperature above 102 F (38.9 C), or as your caregiver suggests.  You notice a bad smell coming from an ulcer or wound. MAKE SURE YOU:   Understand these instructions.  Will watch your condition.  Will get help right away if you are not doing well or get worse. Document Released: 01/23/2000 Document Revised: 04/19/2011 Document Reviewed: 07/31/2008 [facility] Patient Information 2013 [place], [place]. Insulin Aspart injection What is  this medicine? INSULIN ASPART (IN su lin AS part) is a human-made form of insulin. This drug lowers the amount of sugar in your blood. It is a fast acting insulin that starts working faster than regular insulin. It will not work as long as regular insulin. This medicine may be used for other purposes; ask your health care provider or pharmacist if you have questions. What should I tell my health care provider before I take this medicine? They need to know if you have any of these conditions: -episodes of hypoglycemia -kidney disease -liver disease -an unusual or allergic reaction to insulin, metacresol, other medicines, foods, dyes, or preservatives -pregnant or trying to get pregnant -breast-feeding How should I use this medicine? This medicine is for injection under the skin. Use exactly as directed. It is important to follow the directions given to you by your health care professional or doctor. You should start your meal within 5 to 10 minutes after injection. You will be taught how to use this medicine and how to adjust doses for activities and illness. Do not use more insulin than prescribed. Do  not use more or less often than prescribed. Always check the appearance of your insulin before using it. This medicine should be clear and colorless like water. Do not use if it is cloudy, thickened, colored, or has solid particles in it. It is important that you put your used needles and syringes in a special sharps container. Do not put them in a trash can. If you do not have a sharps container, call your pharmacist or healthcare provider to get one. Talk to your pediatrician regarding the use of this medicine in children. While this drug may be prescribed for children as young as 63 years of age for selected conditions, precautions do apply. Overdosage: If you think you have taken too much of this medicine contact a poison control center or emergency room at once. NOTE: This medicine is only for you.  Do not share this medicine with others. What if I miss a dose? It is important not to miss a dose. Your health care professional or doctor should discuss a plan for missed doses with you. If you do miss a dose, follow their plan. Do not take double doses. What may interact with this medicine? -other medicines for diabetes Many medications may cause an increase or decrease in blood sugar, these include: -alcohol containing beverages -aspirin and aspirin-like drugs -chloramphenicol -chromium -diuretics -male hormones, like estrogens or progestins and birth control pills -heart medicines -isoniazid -male hormones or anabolic steroids -medicines for weight loss -medicines for allergies, asthma, cold, or cough -medicines for mental problems -medicines called MAO Inhibitors like Nardil, Parnate, Marplan, Eldepryl -niacin -NSAIDs, medicines for pain and inflammation, like ibuprofen or naproxen -pentamidine -phenytoin -probenecid -quinolone antibiotics like ciprofloxacin, levofloxacin, ofloxacin -some herbal dietary supplements -steroid medicines like prednisone or cortisone -thyroid medicine Some medications can hide the warning symptoms of low blood sugar. You may need to monitor your blood sugar more closely if you are taking one of these medications. These include: -beta-blockers such as atenolol, metoprolol, propranolol -clonidine -guanethidine -reserpine This list may not describe all possible interactions. Give your health care provider a list of all the medicines, herbs, non-prescription drugs, or dietary supplements you use. Also tell them if you smoke, drink alcohol, or use illegal drugs. Some items may interact with your medicine. What should I watch for while using this medicine? Visit your health care professional or doctor for regular checks on your progress. To control your diabetes you must use this medicine regularly and follow a diet and exercise schedule. Checking and  recording your blood sugar and urine ketone levels regularly is important. Use a blood sugar measuring device before you treat high or low blood sugar. Always carry a quick-source of sugar with you in case you have symptoms of low blood sugar. Examples include hard sugar candy or glucose tablets. Make sure family members know that you can choke if you eat or drink when you develop serious symptoms of low blood sugar, such as seizures or unconsciousness. They must get medical help at once. Make sure that you have the right kind of syringe for the type of insulin you use. Try not to change the brand and type of insulin or syringe unless your health care professional or doctor tells you to. Switching insulin brand or type can cause dangerously high or low blood sugar. Always keep an extra supply of insulin, syringes, and needles on hand. Use a syringe one time only.Throw away syringe and needle in a closed container to prevent accidental needle sticks. Insulin  pens and cartridges should never be shared. Sharing may result in passing of viruses like hepatitis or HIV. Wear a medical identification bracelet or chain to say you have diabetes, and carry a card that lists all your medications. Many nonprescription cough and cold products contain sugar or alcohol. These can affect diabetes control or can alter the results of tests used to monitor blood sugar. Avoid alcohol. Avoid products that contain alcohol or sugar. What side effects may I notice from receiving this medicine? Side effects that you should report to your health care professional or doctor as soon as possible: Symptoms of low blood sugar: -You may feel nervous, confused, dizzy, hungry, weak, sweaty, shaky, cold, and irritable. You may also experience headache, blurred vision, rapid heartbeat and loss of consciousness. Symptoms of high blood sugar: -You may experience dizziness, dry mouth, dry skin, fruity breath, loss of appetite, nausea, stomach  ache, unusual thirst, frequent urination Insulin also can cause rare but serious allergic reactions in some patients, including: -bad skin rash and itching -breathing problems Side effects that usually do not require medical attention (report to your health care professional or doctor if they continue or are bothersome): -increase or decrease in fatty tissue under the skin, through overuse of a particular injection -itching, burning, swelling, or rash at the injection site This list may not describe all possible side effects. Call your doctor for medical advice about side effects. You may report side effects to FDA at 1-800-FDA-1088. Where should I keep my medicine? Keep out of the reach of children. Store unopened insulin vials in a refrigerator between 2 and 8 degrees C (36 and 46 degrees F). Do not freeze or use if the insulin has been frozen. Opened vials (vials currently in use) may be stored in the refrigerator or at room temperature, at approximately 30 degrees C (86 degrees F) or cooler. Keeping your insulin at room temperature decreases the amount of pain during injection. Once opened, your insulin can be used for 28 days. After 28 days, the vial of insulin should be thrown away. Store unopened cartridges, FlexPens, or Novalog Innolet systems in a refrigerator between 2 and 8 degrees C (36 and 46 degrees F.) Do not freeze or use if the insulin has been frozen. Once opened, the Novalog Innolet system, FlexPen, and cartridges that are inserted into pens should be kept at room temperature, approximately 25 degrees C (77 degrees F) or cooler. Do not store in the refrigerator. Once opened, the insulin can be used for 28 days. After 28 days, the cartridge, Novalog Innolet system or FlexPen should be thrown away. Protect from light and excessive heat. Throw away any unused medicine after the expiration date or after the specified time for room temperature storage has passed. NOTE: This sheet is a  summary. It may not cover all possible information. If you have questions about this medicine, talk to your doctor, pharmacist, or health care provider.  2012, [employer]. (01/22/2008 10:46:45 AM)

## 2012-02-28 ENCOUNTER — Other Ambulatory Visit (INDEPENDENT_AMBULATORY_CARE_PROVIDER_SITE_OTHER): Payer: 59

## 2012-02-28 DIAGNOSIS — R739 Hyperglycemia, unspecified: Secondary | ICD-10-CM

## 2012-02-28 DIAGNOSIS — IMO0001 Reserved for inherently not codable concepts without codable children: Secondary | ICD-10-CM

## 2012-02-28 LAB — MICROALBUMIN, URINE: Microalb, Ur: 0.5 mg/dL (ref 0.00–1.89)

## 2012-02-28 LAB — GLUCOSE, POCT (MANUAL RESULT ENTRY): POC Glucose: 331 mg/dl — AB (ref 70–99)

## 2012-03-02 ENCOUNTER — Telehealth: Payer: Self-pay

## 2012-03-02 NOTE — Telephone Encounter (Signed)
Please review his glucose level and let us know what to advise pt.

## 2012-03-02 NOTE — Telephone Encounter (Signed)
Pt calling about his lab results please call him at [phone]

## 2012-03-17 ENCOUNTER — Telehealth: Payer: Self-pay | Admitting: Radiology

## 2012-03-17 NOTE — Telephone Encounter (Signed)
Patient has called back in to determine what he is to do regarding his elevated glucose levels, his last reading was 311, prior to this was above 400. I have spoken to Eula Listen, PA and he has advised patient to return to clinic, I advised him he needs to come back in because his glucose is very very elevated.

## 2012-03-17 NOTE — Telephone Encounter (Signed)
Reviewed Dr Loma Boston in basket and there is no evidence that the message was received by him.

## 2012-03-20 MED ORDER — LIRAGLUTIDE 18 MG/3ML ~~LOC~~ SOLN: 0.6000 mg | Freq: Every day | SUBCUTANEOUS | Status: DC

## 2012-03-20 NOTE — Telephone Encounter (Signed)
I saw in the last OV where patient was put on this so I have sent it to the pharmacy - I was unsure of his dose so I put the starting dose - if he has been using continue at the same dose if he has been off of it he needs to start at the low dose again.  We would still like to see him Friday.

## 2012-03-20 NOTE — Telephone Encounter (Signed)
I have called patient to advise, I did apologize to him the message was not received by Dr Milus Glazier and this is the reason his call was not returned. Patient states he does plan to come in for office visit on Friday, he is requesting renewal on the Victoza. However, I do not see this on his list and the pharmacy([facility]) does not have record of this either. I spoke to patient and he was given a sample, he did check his sugars and was doing well on this medication.

## 2012-03-20 NOTE — Telephone Encounter (Signed)
I need to call patient and advise, it does not look like his message was received by Dr Milus Glazier. It was routed, but unsure why it did not deliver to his in basket.

## 2012-03-21 NOTE — Telephone Encounter (Signed)
Thanks, I have called patient to advise. He plans to come in on Friday or on Saturday. I have placed a savings card at front desk for him also.

## 2012-04-20 ENCOUNTER — Emergency Department (INDEPENDENT_AMBULATORY_CARE_PROVIDER_SITE_OTHER)
Admission: EM | Admit: 2012-04-20 | Discharge: 2012-04-20 | Disposition: A | Discharge status: Home / Self Care | Payer: 59 | Source: Home / Self Care

## 2012-04-20 ENCOUNTER — Encounter (HOSPITAL_COMMUNITY): Payer: Self-pay | Admitting: Emergency Medicine

## 2012-04-20 MED ORDER — ACETAMINOPHEN 325 MG PO TABS
ORAL_TABLET | ORAL | Status: AC
  Filled 2012-04-20: qty 3

## 2012-04-20 NOTE — ED Notes (Signed)
Pt is c/o body aches, fever, and nausea since yesterday evening. Pt has been taking tylenol for fever and body aches with mild relief.  Pt denies any other symptoms

## 2012-04-20 NOTE — ED Provider Notes (Signed)
History     CSN: [id number]  Arrival date & time 04/20/12  6578   First MD Initiated Contact with Patient 04/20/12 1906      Chief Complaint  Patient presents with  . Influenza    body aches fever nausea    (Consider location/radiation/quality/duration/timing/severity/associated sxs/prior treatment) HPI Comments: 37 y.o. M with sudden onset body aches, fevers, headache, sore throat, post nasal drip, nausea since yesterday PM.  Patient does have DM, relatively poorly controlled as he just started seeing a physician for this.    Patient is a 37 y.o. male presenting with flu symptoms. The history is provided by the patient. No language interpreter was used.  Influenza Presenting symptoms: cough, fatigue, fever, headache, myalgias, nausea, rhinorrhea and sore throat   Presenting symptoms: no diarrhea, no shortness of breath and no vomiting   Severity:  Severe Onset quality:  Sudden Duration:  1 day Progression:  Worsening Chronicity:  New Relieved by:  None tried Worsened by:  Nothing tried Ineffective treatments:  None tried Associated symptoms: chills, decreased appetite, decreased physical activity and nasal congestion   Associated symptoms: no ear pain, no mental status change, no neck stiffness and no witnessed syncope   Risk factors: diabetes   Risk factors: not elderly, no heart disease, no immunocompromised state, no kidney disease, no liver disease and no sick contacts     Past Medical History  Diagnosis Date  . Hypertension   . Diabetes mellitus   . Allergy   . Asthma     Past Surgical History  Procedure Laterality Date  . Appendectomy    . Knee surgery    . Hernia repair    . Fracture surgery      Family History  Problem Relation Age of Onset  . Diabetes Mother   . Hypertension Mother   . Kidney disease Father   . Hypertension Father   . Diabetes Maternal Grandmother   . Hypertension Maternal Grandmother   . Stroke Maternal Grandmother   . Kidney  disease Maternal Grandmother   . Depression Brother     History  Substance Use Topics  . Smoking status: Never Smoker   . Smokeless tobacco: Not on file  . Alcohol Use: Yes      Review of Systems  Constitutional: Positive for fever, chills, diaphoresis, fatigue and decreased appetite.  HENT: Positive for congestion, sore throat, rhinorrhea and postnasal drip. Negative for ear pain, trouble swallowing, neck pain and neck stiffness.   Respiratory: Positive for cough. Negative for chest tightness and shortness of breath.   Cardiovascular: Negative for chest pain.  Gastrointestinal: Positive for nausea. Negative for vomiting, abdominal pain and diarrhea.  Musculoskeletal: Positive for myalgias.  Skin: Negative for rash.  Neurological: Positive for headaches. Negative for dizziness, syncope, weakness and light-headedness.    Allergies  Other and Penicillins  Home Medications   Current Outpatient Rx  Name  Route  Sig  Dispense  Refill  . acetaminophen (TYLENOL) 500 MG tablet   Oral   Take 500 mg by mouth every 6 (six) hours as needed. For pain.         Marland Kitchen glipiZIDE (GLUCOTROL) 5 MG tablet   Oral   Take 5 mg by mouth every morning.          . insulin glargine (LANTUS) 100 UNIT/ML injection   Subcutaneous   Inject 10 Units into the skin at bedtime.         . Liraglutide (VICTOZA) 18 MG/3ML SOLN injection  Subcutaneous   Inject 0.1 mLs (0.6 mg total) into the skin daily.   6 mg   0   . metFORMIN (GLUCOPHAGE) 1000 MG tablet   Oral   Take 1 tablet (1,000 mg total) by mouth 2 (two) times daily with a meal.   180 tablet   3     BP 156/91  Pulse 110  Temp(Src) 103.1 F (39.5 C) (Oral)  Resp 18  SpO2 99%  Physical Exam  Nursing note and vitals reviewed. Constitutional: He appears well-developed and well-nourished.  Asleep on exam room table  HENT:  Head: Normocephalic and atraumatic.  Nose: Nose normal.  Mouth/Throat: Oropharynx is clear and moist. No  oropharyngeal exudate.  Eyes: Conjunctivae and EOM are normal. Pupils are equal, round, and reactive to light. No scleral icterus.  Neck: Normal range of motion. Neck supple.  Cardiovascular: Regular rhythm, normal heart sounds and intact distal pulses.  Tachycardia present.   No murmur heard. Pulmonary/Chest: Effort normal and breath sounds normal. No respiratory distress. He has no wheezes. He has no rales.  Abdominal: Soft. Bowel sounds are normal. There is no tenderness.  Musculoskeletal: He exhibits no edema.  Lymphadenopathy:    He has no cervical adenopathy.  Neurological: He is alert.  Skin: Skin is warm. No rash noted.    ED Course  Procedures (including critical care time)  Labs Reviewed - No data to display No results found.   1. Influenza    Symptomatic treatment. Sick day insulin discussed.     MDM          Tito Dine, MD 04/20/12 [phone]

## 2012-04-20 NOTE — ED Notes (Signed)
Pt give 975 mg tylenol at 7:29 for fever. Mw,cma

## 2012-04-21 NOTE — ED Provider Notes (Signed)
Medical screening examination/treatment/procedure(s) were performed by a resident physician and as supervising physician I was immediately available for consultation/collaboration.  Leslee Home, M.D.  Reuben Likes, MD 04/21/12 [phone]

## 2012-08-04 ENCOUNTER — Ambulatory Visit: Payer: Self-pay | Admitting: Endocrinology

## 2012-08-04 DIAGNOSIS — Z0289 Encounter for other administrative examinations: Secondary | ICD-10-CM

## 2012-08-27 ENCOUNTER — Encounter (HOSPITAL_COMMUNITY): Payer: Self-pay | Admitting: *Deleted

## 2012-08-27 ENCOUNTER — Emergency Department (HOSPITAL_COMMUNITY): Payer: 59

## 2012-08-27 ENCOUNTER — Emergency Department (HOSPITAL_COMMUNITY)
Admission: EM | Admit: 2012-08-27 | Discharge: 2012-08-27 | Disposition: A | Discharge status: Home / Self Care | Payer: 59 | Attending: Emergency Medicine | Admitting: Emergency Medicine

## 2012-08-27 DIAGNOSIS — N5089 Other specified disorders of the male genital organs: Secondary | ICD-10-CM | POA: Insufficient documentation

## 2012-08-27 DIAGNOSIS — N498 Inflammatory disorders of other specified male genital organs: Secondary | ICD-10-CM | POA: Insufficient documentation

## 2012-08-27 DIAGNOSIS — N492 Inflammatory disorders of scrotum: Secondary | ICD-10-CM

## 2012-08-27 DIAGNOSIS — N509 Disorder of male genital organs, unspecified: Secondary | ICD-10-CM | POA: Insufficient documentation

## 2012-08-27 DIAGNOSIS — Y939 Activity, unspecified: Secondary | ICD-10-CM | POA: Insufficient documentation

## 2012-08-27 DIAGNOSIS — J45909 Unspecified asthma, uncomplicated: Secondary | ICD-10-CM | POA: Insufficient documentation

## 2012-08-27 DIAGNOSIS — Z880 Allergy status to penicillin: Secondary | ICD-10-CM | POA: Insufficient documentation

## 2012-08-27 DIAGNOSIS — Z79899 Other long term (current) drug therapy: Secondary | ICD-10-CM | POA: Insufficient documentation

## 2012-08-27 DIAGNOSIS — I10 Essential (primary) hypertension: Secondary | ICD-10-CM | POA: Insufficient documentation

## 2012-08-27 DIAGNOSIS — Y929 Unspecified place or not applicable: Secondary | ICD-10-CM | POA: Insufficient documentation

## 2012-08-27 DIAGNOSIS — Z794 Long term (current) use of insulin: Secondary | ICD-10-CM | POA: Insufficient documentation

## 2012-08-27 DIAGNOSIS — E119 Type 2 diabetes mellitus without complications: Secondary | ICD-10-CM | POA: Insufficient documentation

## 2012-08-27 MED ORDER — OXYCODONE-ACETAMINOPHEN 5-325 MG PO TABS: 1.0000 | ORAL_TABLET | ORAL | Status: DC | PRN

## 2012-08-27 MED ORDER — ONDANSETRON 4 MG PO TBDP
4.0000 mg | ORAL_TABLET | Freq: Once | ORAL | Status: AC
  Administered 2012-08-27: 4 mg via ORAL
  Filled 2012-08-27: qty 1

## 2012-08-27 MED ORDER — OXYCODONE-ACETAMINOPHEN 5-325 MG PO TABS
2.0000 | ORAL_TABLET | Freq: Once | ORAL | Status: AC
  Administered 2012-08-27: 2 via ORAL
  Filled 2012-08-27: qty 2

## 2012-08-27 NOTE — ED Notes (Signed)
Pt states that he was bitten by a Tick a week ago on genitals. Now has knot on scrotum. Has been taking Doxycycline with no relief.

## 2012-08-27 NOTE — ED Notes (Signed)
Pt transported to Ultrasound on stretcher with transporter.

## 2012-08-27 NOTE — ED Provider Notes (Signed)
Pt with abscess of the R hemiscrotum - I have discussed his care with Dr. Vernie Ammons who recommends I and D in the ED - I have performed this myself.  On my exam he has no perineal tenderness, no signs of penile rash or d/c.  There is no signs of Fournier's Gangrene.  Pt tolerated procedure.  INCISION AND DRAINAGE Performed by: Eber Hong D Consent: Verbal consent obtained. Risks and benefits: risks, benefits and alternatives were discussed Type: abscess  Body area: R hemiscrotum  Anesthesia: local infiltration  Incision was made with a scalpel.  Local anesthetic: lidocaine 2% without epinephrine  Anesthetic total: 3 ml  Complexity: complex Blunt dissection to break up loculations  Drainage: purulent  Drainage amount: moderate purulent material  Packing material: 1/4 in iodoform gauze  Patient tolerance: Patient tolerated the procedure well with no immediate complications.   Medical screening examination/treatment/procedure(s) were conducted as a shared visit with non-physician practitioner(s) and myself.  I personally evaluated the patient during the encounter    Vida Roller, MD 08/27/12 1252

## 2012-08-27 NOTE — ED Notes (Signed)
Patient states he was bitten by a tick last week. Was seen by the [facility] and given Doxcyc. States he started taking it Fri.c/o swelling to right testicle.

## 2012-08-27 NOTE — ED Notes (Signed)
Care transferred, report received Kelly Moon, RN. 

## 2012-08-27 NOTE — ED Provider Notes (Signed)
History    CSN: [id number] Arrival date & time 08/27/12  4098  First MD Initiated Contact with Patient 08/27/12 [phone]     Chief Complaint  Patient presents with  . Insect Bite   (Consider location/radiation/quality/duration/timing/severity/associated sxs/prior Treatment) HPI  Timothy Heath is a(n) 37 y.o. male who presents chief complaint of right scrotal pain after a tick bite.  Patient states he was the bitten by a tick 4 days ago.  2 days after the tick bite he began having severe swelling and pain in the right scrotum.  He was seen at work by the medical staff and started on doxycycline.  Patient states that his pain became unbearable he's had increased swelling heat and redness.  He denies any urinary symptoms or penile discharge.  Past Medical History  Diagnosis Date  . Hypertension   . Diabetes mellitus   . Allergy   . Asthma    Past Surgical History  Procedure Laterality Date  . Appendectomy    . Knee surgery    . Hernia repair    . Fracture surgery     Family History  Problem Relation Age of Onset  . Diabetes Mother   . Hypertension Mother   . Kidney disease Father   . Hypertension Father   . Diabetes Maternal Grandmother   . Hypertension Maternal Grandmother   . Stroke Maternal Grandmother   . Kidney disease Maternal Grandmother   . Depression Brother    History  Substance Use Topics  . Smoking status: Never Smoker   . Smokeless tobacco: Not on file  . Alcohol Use: Yes    Review of Systems Ten systems reviewed and are negative for acute change, except as noted in the HPI.   Allergies  Other and Penicillins  Home Medications   Current Outpatient Rx  Name  Route  Sig  Dispense  Refill  . acetaminophen (TYLENOL) 500 MG tablet   Oral   Take 1,000 mg by mouth every 6 (six) hours as needed. For pain.         Marland Kitchen doxycycline (VIBRAMYCIN) 100 MG capsule   Oral   Take 100 mg by mouth 2 (two) times daily.         Marland Kitchen glipiZIDE (GLUCOTROL) 5 MG  tablet   Oral   Take 5 mg by mouth every morning.          Marland Kitchen ibuprofen (ADVIL,MOTRIN) 200 MG tablet   Oral   Take 600 mg by mouth every 6 (six) hours as needed for pain.         Marland Kitchen insulin glargine (LANTUS) 100 UNIT/ML injection   Subcutaneous   Inject 10 Units into the skin at bedtime.         . Liraglutide (VICTOZA) 18 MG/3ML SOLN injection   Subcutaneous   Inject 0.1 mLs (0.6 mg total) into the skin daily.   6 mg   0   . metFORMIN (GLUCOPHAGE) 1000 MG tablet   Oral   Take 1 tablet (1,000 mg total) by mouth 2 (two) times daily with a meal.   180 tablet   3    BP 157/89  Pulse 92  Temp(Src) 98.3 F (36.8 C) (Oral)  Resp 17  Ht 5' 11.25" (1.81 m)  Wt 250 lb (113.399 kg)  BMI 34.61 kg/m2  SpO2 96% Physical Exam  Nursing note and vitals reviewed. Constitutional: He appears well-developed and well-nourished. No distress.  HENT:  Head: Normocephalic and atraumatic.  Eyes: Conjunctivae are  normal. No scleral icterus.  Neck: Normal range of motion. Neck supple.  Cardiovascular: Normal rate, regular rhythm and normal heart sounds.   Pulmonary/Chest: Effort normal and breath sounds normal. No respiratory distress.  Abdominal: Soft. There is no tenderness.  Genitourinary: Penis normal.    Right testis shows swelling and tenderness.  Musculoskeletal: He exhibits no edema.  Neurological: He is alert.  Skin: Skin is warm and dry. He is not diaphoretic.  Psychiatric: His behavior is normal.    ED Course  Procedures (including critical care time) Labs Reviewed - No data to display US Scrotum  08/27/2012   *RADIOLOGY REPORT*  Clinical Data:  Right testicular swelling/pain, tick bite, fever  SCROTAL ULTRASOUND DOPPLER ULTRASOUND OF THE TESTICLES  Technique: Complete ultrasound examination of the testicles, epididymis, and other scrotal structures was performed.  Color and spectral Doppler ultrasound were also utilized to evaluate blood flow to the testicles.   Comparison:  None.  Findings:  Right testis:  Normal in size and appearance, measuring 3.7 x 2.4 x 3.0 cm.  Left testis:  Normal in size and appearance, measuring 4.0 x 2.3 x 2.6 cm.  Right epididymis:  Not well visualized.  Left epididymis:  Normal in size and appearance.  Hydrocele:  Absent  Varicocele:  Absent  Pulsed Doppler interrogation of both testes demonstrates low resistance flow bilaterally.  Scrotal wall thickening with associated 1.6 x 3.2 x 1.8 cm complex fluid collection/abscess along the lateral aspect of the right scrotum.  IMPRESSION: No evidence of testicular torsion.  Suspected 3.2 cm right scrotal wall abscess.  Associated scrotal wall thickening.   Original Report Authenticated By: Charline Bills, M.D.   [place] Art/ven Flow Abd Pelv Doppler  08/27/2012   *RADIOLOGY REPORT*  Clinical Data:  Right testicular swelling/pain, tick bite, fever  SCROTAL ULTRASOUND DOPPLER ULTRASOUND OF THE TESTICLES  Technique: Complete ultrasound examination of the testicles, epididymis, and other scrotal structures was performed.  Color and spectral Doppler ultrasound were also utilized to evaluate blood flow to the testicles.  Comparison:  None.  Findings:  Right testis:  Normal in size and appearance, measuring 3.7 x 2.4 x 3.0 cm.  Left testis:  Normal in size and appearance, measuring 4.0 x 2.3 x 2.6 cm.  Right epididymis:  Not well visualized.  Left epididymis:  Normal in size and appearance.  Hydrocele:  Absent  Varicocele:  Absent  Pulsed Doppler interrogation of both testes demonstrates low resistance flow bilaterally.  Scrotal wall thickening with associated 1.6 x 3.2 x 1.8 cm complex fluid collection/abscess along the lateral aspect of the right scrotum.  IMPRESSION: No evidence of testicular torsion.  Suspected 3.2 cm right scrotal wall abscess.  Associated scrotal wall thickening.   Original Report Authenticated By: Charline Bills, M.D.   1. Scrotal abscess     MDM  Patient US shows fluid  collectio in the scrotum and given his clinical picture appears to be an abscess. Patient seen in shared visit wit Dr. Hyacinth Meeker. He has spoken with Urology who has asked that he ED providers perform the I&D. (please see Dr. Leavy Cella note)/   Patient I&D performed. He will be discharged and follow up with urology. Continue on doxy. Percocet for pain. Wound care instruction provided. The patient appears reasonably screened and/or stabilized for discharge and I doubt any other medical condition or other [facility] requiring further screening, evaluation, or treatment in the ED at this time prior to discharge.    Arthor Captain, PA-C 08/27/12 [phone]

## 2012-08-28 NOTE — ED Provider Notes (Signed)
Medical screening examination/treatment/procedure(s) were conducted as a shared visit with non-physician practitioner(s) and myself.  I personally evaluated the patient during the encounter  Please see my separate respective documentation pertaining to this patient encounter   Vida Roller, MD 08/28/12 [phone]

## 2013-01-01 ENCOUNTER — Emergency Department (HOSPITAL_COMMUNITY)
Admission: EM | Admit: 2013-01-01 | Discharge: 2013-01-02 | Disposition: A | Discharge status: Home / Self Care | Payer: 59 | Attending: Emergency Medicine | Admitting: Emergency Medicine

## 2013-01-01 DIAGNOSIS — Z794 Long term (current) use of insulin: Secondary | ICD-10-CM | POA: Insufficient documentation

## 2013-01-01 DIAGNOSIS — Z880 Allergy status to penicillin: Secondary | ICD-10-CM | POA: Insufficient documentation

## 2013-01-01 DIAGNOSIS — E119 Type 2 diabetes mellitus without complications: Secondary | ICD-10-CM | POA: Insufficient documentation

## 2013-01-01 DIAGNOSIS — Y9389 Activity, other specified: Secondary | ICD-10-CM | POA: Insufficient documentation

## 2013-01-01 DIAGNOSIS — X58XXXA Exposure to other specified factors, initial encounter: Secondary | ICD-10-CM | POA: Insufficient documentation

## 2013-01-01 DIAGNOSIS — I10 Essential (primary) hypertension: Secondary | ICD-10-CM | POA: Insufficient documentation

## 2013-01-01 DIAGNOSIS — S0501XA Injury of conjunctiva and corneal abrasion without foreign body, right eye, initial encounter: Secondary | ICD-10-CM

## 2013-01-01 DIAGNOSIS — H53149 Visual discomfort, unspecified: Secondary | ICD-10-CM | POA: Insufficient documentation

## 2013-01-01 DIAGNOSIS — Y9229 Other specified public building as the place of occurrence of the external cause: Secondary | ICD-10-CM | POA: Insufficient documentation

## 2013-01-01 DIAGNOSIS — J45909 Unspecified asthma, uncomplicated: Secondary | ICD-10-CM | POA: Insufficient documentation

## 2013-01-01 DIAGNOSIS — S058X9A Other injuries of unspecified eye and orbit, initial encounter: Secondary | ICD-10-CM | POA: Insufficient documentation

## 2013-01-01 DIAGNOSIS — Z79899 Other long term (current) drug therapy: Secondary | ICD-10-CM | POA: Insufficient documentation

## 2013-01-01 MED ORDER — TETRACAINE HCL 0.5 % OP SOLN
2.0000 [drp] | Freq: Once | OPHTHALMIC | Status: AC
  Administered 2013-01-02: 2 [drp] via OPHTHALMIC
  Filled 2013-01-01: qty 4

## 2013-01-01 NOTE — ED Provider Notes (Signed)
CSN: [id number]     Arrival date & time 01/01/13  2332 History  This chart was scribed for non-physician practitioner, Earley Favor, FNP,working with Derwood Kaplan, MD, by Karle Plumber, ED Scribe.  This patient was seen in room WTR8/WTR8 and the patient's care was started at 11:46 PM.  Chief Complaint  Patient presents with  . Eye Pain   The history is provided by the patient. No language interpreter was used.   HPI Comments:  Timothy Heath is a 37 y.o. male who presents to the Emergency Department complaining of sudden onset right eye pain while he was in the movie theater 24 hours ago. Pt states he went to work today and was experiencing photophobia to the sunlight. He states that once he got home, the pain seemed to subside, but once he looked into the refrigerator, the pain returned. Pt reports associated redness of the right eye. Pt denies wearing contact lenses. He denies drainage or itching.   Past Medical History  Diagnosis Date  . Hypertension   . Diabetes mellitus   . Allergy   . Asthma    Past Surgical History  Procedure Laterality Date  . Appendectomy    . Knee surgery    . Hernia repair    . Fracture surgery     Family History  Problem Relation Age of Onset  . Diabetes Mother   . Hypertension Mother   . Kidney disease Father   . Hypertension Father   . Diabetes Maternal Grandmother   . Hypertension Maternal Grandmother   . Stroke Maternal Grandmother   . Kidney disease Maternal Grandmother   . Depression Brother    History  Substance Use Topics  . Smoking status: Never Smoker   . Smokeless tobacco: Not on file  . Alcohol Use: Yes    Review of Systems  Eyes: Positive for photophobia, pain and redness. Negative for discharge and itching.  All other systems reviewed and are negative.   Allergies  Other and Penicillins  Home Medications   Current Outpatient Rx  Name  Route  Sig  Dispense  Refill  . acetaminophen (TYLENOL) 500 MG tablet    Oral   Take 1,000 mg by mouth every 6 (six) hours as needed. For pain.         . metFORMIN (GLUCOPHAGE) 1000 MG tablet   Oral   Take 1 tablet (1,000 mg total) by mouth 2 (two) times daily with a meal.   180 tablet   3   . glipiZIDE (GLUCOTROL) 5 MG tablet   Oral   Take 5 mg by mouth every morning.          Marland Kitchen ibuprofen (ADVIL,MOTRIN) 200 MG tablet   Oral   Take 600 mg by mouth every 6 (six) hours as needed for pain.         Marland Kitchen insulin glargine (LANTUS) 100 UNIT/ML injection   Subcutaneous   Inject 10 Units into the skin at bedtime.         . Liraglutide (VICTOZA) 18 MG/3ML SOLN injection   Subcutaneous   Inject 0.1 mLs (0.6 mg total) into the skin daily.   6 mg   0   . oxyCODONE-acetaminophen (PERCOCET) 5-325 MG per tablet   Oral   Take 1-2 tablets by mouth every 4 (four) hours as needed for pain.   20 tablet   0    BP 162/86  Pulse 91  Temp(Src) 98.1 F (36.7 C) (Oral)  Resp 16  SpO2 98% Physical Exam  Nursing note and vitals reviewed. Constitutional: He is oriented to person, place, and time. He appears well-developed and well-nourished.  HENT:  Head: Normocephalic and atraumatic.  Eyes:  Large abrasion covering the iris. Extends from the 4 o'clock location to the 7 o'clock location not involving the pupil. Positive fluorescein dye uptake.   Neck: Neck supple.  Pulmonary/Chest: Effort normal.  Neurological: He is alert and oriented to person, place, and time.  Psychiatric: He has a normal mood and affect. His behavior is normal.    ED Course  Procedures (including critical care time) DIAGNOSTIC STUDIES:   COORDINATION OF CARE: 11:49 PM- Will administer tetracaine eye drops and test for corneal abrasion. Pt verbalizes understanding and agrees to plan.  11:57 PM- Positive for corneal abrasion of the right eye. Will prescribe antibiotic ointment for the eye. Will administer a tetanus vaccination.   Medications  Tdap (BOOSTRIX) injection 0.5 mL  (not administered)  erythromycin ophthalmic ointment (not administered)  tetracaine (PONTOCAINE) 0.5 % ophthalmic solution 2 drop (2 drops Right Eye Given 01/02/13 0010)   Labs Review Labs Reviewed - No data to display Imaging Review No results found.  EKG Interpretation   None       MDM   1. Corneal abrasion, right, initial encounter     Patient has a large abrasion to the bottom portion of the right eye over the iris.  Not involving the pupil is been given erythromycin ointment to use 4 times a day.  Tetanus shot.  Has been updated is also been given referral to ophthalmology for followup in the next 3-5 days I personally performed the services described in this documentation, which was scribed in my presence. The recorded information has been reviewed and is accurate.   Arman Filter, NP 01/02/13 0025   Medical screening examination/treatment/procedure(s) were performed by non-physician practitioner and as supervising physician I was immediately available for consultation/collaboration.  EKG Interpretation   None         Derwood Kaplan, MD 01/13/13 [phone]

## 2013-01-02 ENCOUNTER — Encounter (HOSPITAL_COMMUNITY): Payer: Self-pay | Admitting: Emergency Medicine

## 2013-01-02 MED ORDER — ERYTHROMYCIN 5 MG/GM OP OINT
TOPICAL_OINTMENT | Freq: Once | OPHTHALMIC | Status: AC
  Administered 2013-01-02: 01:00:00 via OPHTHALMIC
  Filled 2013-01-02: qty 3.5

## 2013-01-02 MED ORDER — TETANUS-DIPHTH-ACELL PERTUSSIS 5-2.5-18.5 LF-MCG/0.5 IM SUSP
0.5000 mL | Freq: Once | INTRAMUSCULAR | Status: AC
  Administered 2013-01-02: 0.5 mL via INTRAMUSCULAR
  Filled 2013-01-02: qty 0.5

## 2013-01-02 NOTE — ED Notes (Signed)
Pt sts that Sunday night his eye began to hurt and it has gotten progressively worse. Denies Injury. Denies double vision. C/o redness, pain, and swelling in and around R eye. NAD notedm A&OX4

## 2013-12-16 ENCOUNTER — Encounter (HOSPITAL_COMMUNITY): Payer: Self-pay | Admitting: Oncology

## 2013-12-16 ENCOUNTER — Emergency Department (HOSPITAL_COMMUNITY)
Admission: EM | Admit: 2013-12-16 | Discharge: 2013-12-16 | Disposition: A | Discharge status: Home / Self Care | Payer: 59 | Attending: Emergency Medicine | Admitting: Emergency Medicine

## 2013-12-16 DIAGNOSIS — Z880 Allergy status to penicillin: Secondary | ICD-10-CM | POA: Diagnosis not present

## 2013-12-16 DIAGNOSIS — I10 Essential (primary) hypertension: Secondary | ICD-10-CM | POA: Insufficient documentation

## 2013-12-16 DIAGNOSIS — Z9114 Patient's other noncompliance with medication regimen: Secondary | ICD-10-CM | POA: Diagnosis not present

## 2013-12-16 DIAGNOSIS — R51 Headache: Secondary | ICD-10-CM | POA: Insufficient documentation

## 2013-12-16 DIAGNOSIS — Z794 Long term (current) use of insulin: Secondary | ICD-10-CM | POA: Insufficient documentation

## 2013-12-16 DIAGNOSIS — J45909 Unspecified asthma, uncomplicated: Secondary | ICD-10-CM | POA: Insufficient documentation

## 2013-12-16 DIAGNOSIS — E1165 Type 2 diabetes mellitus with hyperglycemia: Secondary | ICD-10-CM

## 2013-12-16 DIAGNOSIS — IMO0002 Reserved for concepts with insufficient information to code with codable children: Secondary | ICD-10-CM

## 2013-12-16 DIAGNOSIS — Z79899 Other long term (current) drug therapy: Secondary | ICD-10-CM | POA: Diagnosis not present

## 2013-12-16 DIAGNOSIS — R739 Hyperglycemia, unspecified: Secondary | ICD-10-CM

## 2013-12-16 LAB — CBC WITH DIFFERENTIAL/PLATELET
BASOS ABS: 0.1 10*3/uL (ref 0.0–0.1)
BASOS PCT: 1 % (ref 0–1)
EOS ABS: 0.3 10*3/uL (ref 0.0–0.7)
Eosinophils Relative: 3 % (ref 0–5)
HCT: 43.9 % (ref 39.0–52.0)
Hemoglobin: 14.8 g/dL (ref 13.0–17.0)
LYMPHS ABS: 3.2 10*3/uL (ref 0.7–4.0)
Lymphocytes Relative: 33 % (ref 12–46)
MCH: 26.1 pg (ref 26.0–34.0)
MCHC: 33.7 g/dL (ref 30.0–36.0)
MCV: 77.4 fL — ABNORMAL LOW (ref 78.0–100.0)
Monocytes Absolute: 0.7 10*3/uL (ref 0.1–1.0)
Monocytes Relative: 8 % (ref 3–12)
NEUTROS PCT: 55 % (ref 43–77)
Neutro Abs: 5.4 10*3/uL (ref 1.7–7.7)
PLATELETS: 208 10*3/uL (ref 150–400)
RBC: 5.67 MIL/uL (ref 4.22–5.81)
RDW: 13.4 % (ref 11.5–15.5)
WBC: 9.6 10*3/uL (ref 4.0–10.5)

## 2013-12-16 LAB — BASIC METABOLIC PANEL
ANION GAP: 13 (ref 5–15)
BUN: 14 mg/dL (ref 6–23)
CO2: 27 mEq/L (ref 19–32)
Calcium: 9.8 mg/dL (ref 8.4–10.5)
Chloride: 95 mEq/L — ABNORMAL LOW (ref 96–112)
Creatinine, Ser: 1.35 mg/dL (ref 0.50–1.35)
GFR, EST AFRICAN AMERICAN: 77 mL/min — AB (ref >90)
GFR, EST NON AFRICAN AMERICAN: 66 mL/min — AB (ref >90)
GLUCOSE: 557 mg/dL — AB (ref 70–99)
POTASSIUM: 4.6 meq/L (ref 3.7–5.3)
SODIUM: 135 meq/L — AB (ref 137–147)

## 2013-12-16 LAB — CBG MONITORING, ED
Glucose-Capillary: 568 mg/dL (ref 70–99)
Glucose-Capillary: 455 mg/dL — ABNORMAL HIGH (ref 70–99)
Glucose-Capillary: 359 mg/dL — ABNORMAL HIGH (ref 70–99)

## 2013-12-16 MED ORDER — SODIUM CHLORIDE 0.9 % IV BOLUS (SEPSIS)
1000.0000 mL | Freq: Once | INTRAVENOUS | Status: AC
  Administered 2013-12-16: 1000 mL via INTRAVENOUS

## 2013-12-16 MED ORDER — METFORMIN HCL 1000 MG PO TABS: 1000.0000 mg | ORAL_TABLET | Freq: Two times a day (BID) | ORAL | Status: DC

## 2013-12-16 MED ORDER — METFORMIN HCL 500 MG PO TABS
1000.0000 mg | ORAL_TABLET | Freq: Two times a day (BID) | ORAL | Status: DC
  Administered 2013-12-16: 1000 mg via ORAL
  Filled 2013-12-16 (×4): qty 2

## 2013-12-16 NOTE — ED Notes (Signed)
Pt is a type II diabetic and was unable to get his medications for 2 months.  Pt reports that his BG has been elevated for several days.

## 2013-12-16 NOTE — ED Notes (Signed)
Awaiting for Metformin, requested from pharmacy.

## 2013-12-16 NOTE — Discharge Instructions (Signed)
Diabetes and Exercise Exercising regularly is important. It is not just about losing weight. It has many health benefits, such as:  Improving your overall fitness, flexibility, and endurance.  Increasing your bone density.  Helping with weight control.  Decreasing your body fat.  Increasing your muscle strength.  Reducing stress and tension.  Improving your overall health. People with diabetes who exercise gain additional benefits because exercise:  Reduces appetite.  Improves the body's use of blood sugar (glucose).  Helps lower or control blood glucose.  Decreases blood pressure.  Helps control blood lipids (such as cholesterol and triglycerides).  Improves the body's use of the hormone insulin by:  Increasing the body's insulin sensitivity.  Reducing the body's insulin needs.  Decreases the risk for heart disease because exercising:  Lowers cholesterol and triglycerides levels.  Increases the levels of good cholesterol (such as high-density lipoproteins [HDL]) in the body.  Lowers blood glucose levels. YOUR ACTIVITY PLAN  Choose an activity that you enjoy and set realistic goals. Your health care provider or diabetes educator can help you make an activity plan that works for you. Exercise regularly as directed by your health care provider. This includes:  Performing resistance training twice a week such as push-ups, sit-ups, lifting weights, or using resistance bands.  Performing 150 minutes of cardio exercises each week such as walking, running, or playing sports.  Staying active and spending no more than 90 minutes at one time being inactive. Even short bursts of exercise are good for you. Three 10-minute sessions spread throughout the day are just as beneficial as a single 30-minute session. Some exercise ideas include:  Taking the dog for a walk.  Taking the stairs instead of the elevator.  Dancing to your favorite song.  Doing an exercise  video.  Doing your favorite exercise with a friend. RECOMMENDATIONS FOR EXERCISING WITH TYPE 1 OR TYPE 2 DIABETES   Check your blood glucose before exercising. If blood glucose levels are greater than 240 mg/dL, check for urine ketones. Do not exercise if ketones are present.  Avoid injecting insulin into areas of the body that are going to be exercised. For example, avoid injecting insulin into:  The arms when playing tennis.  The legs when jogging.  Keep a record of:  Food intake before and after you exercise.  Expected peak times of insulin action.  Blood glucose levels before and after you exercise.  The type and amount of exercise you have done.  Review your records with your health care provider. Your health care provider will help you to develop guidelines for adjusting food intake and insulin amounts before and after exercising.  If you take insulin or oral hypoglycemic agents, watch for signs and symptoms of hypoglycemia. They include:  Dizziness.  Shaking.  Sweating.  Chills.  Confusion.  Drink plenty of water while you exercise to prevent dehydration or heat stroke. Body water is lost during exercise and must be replaced.  Talk to your health care provider before starting an exercise program to make sure it is safe for you. Remember, almost any type of activity is better than none. Document Released: 04/17/2003 Document Revised: 06/11/2013 Document Reviewed: 07/04/2012 [facility] Patient Information 2015 [place], [place]. This information is not intended to replace advice given to you by your health care provider. Make sure you discuss any questions you have with your health care provider.  Diabetes and Foot Care Diabetes may cause you to have problems because of poor blood supply (circulation) to your feet  and legs. This may cause the skin on your feet to become thinner, break easier, and heal more slowly. Your skin may become dry, and the skin may peel and crack.  You may also have nerve damage in your legs and feet causing decreased feeling in them. You may not notice minor injuries to your feet that could lead to infections or more serious problems. Taking care of your feet is one of the most important things you can do for yourself.  HOME CARE INSTRUCTIONS  Wear shoes at all times, even in the house. Do not go barefoot. Bare feet are easily injured.  Check your feet daily for blisters, cuts, and redness. If you cannot see the bottom of your feet, use a mirror or ask someone for help.  Wash your feet with warm water (do not use hot water) and mild soap. Then pat your feet and the areas between your toes until they are completely dry. Do not soak your feet as this can dry your skin.  Apply a moisturizing lotion or petroleum jelly (that does not contain alcohol and is unscented) to the skin on your feet and to dry, brittle toenails. Do not apply lotion between your toes.  Trim your toenails straight across. Do not dig under them or around the cuticle. File the edges of your nails with an emery board or nail file.  Do not cut corns or calluses or try to remove them with medicine.  Wear clean socks or stockings every day. Make sure they are not too tight. Do not wear knee-high stockings since they may decrease blood flow to your legs.  Wear shoes that fit properly and have enough cushioning. To break in new shoes, wear them for just a few hours a day. This prevents you from injuring your feet. Always look in your shoes before you put them on to be sure there are no objects inside.  Do not cross your legs. This may decrease the blood flow to your feet.  If you find a minor scrape, cut, or break in the skin on your feet, keep it and the skin around it clean and dry. These areas may be cleansed with mild soap and water. Do not cleanse the area with peroxide, alcohol, or iodine.  When you remove an adhesive bandage, be sure not to damage the skin around  it.  If you have a wound, look at it several times a day to make sure it is healing.  Do not use heating pads or hot water bottles. They may burn your skin. If you have lost feeling in your feet or legs, you may not know it is happening until it is too late.  Make sure your health care provider performs a complete foot exam at least annually or more often if you have foot problems. Report any cuts, sores, or bruises to your health care provider immediately. SEEK MEDICAL CARE IF:   You have an injury that is not healing.  You have cuts or breaks in the skin.  You have an ingrown nail.  You notice redness on your legs or feet.  You feel burning or tingling in your legs or feet.  You have pain or cramps in your legs and feet.  Your legs or feet are numb.  Your feet always feel cold. SEEK IMMEDIATE MEDICAL CARE IF:   There is increasing redness, swelling, or pain in or around a wound.  There is a red line that goes up your leg.  Pus is coming from a wound.  You develop a fever or as directed by your health care provider.  You notice a bad smell coming from an ulcer or wound. Document Released: 01/23/2000 Document Revised: 09/27/2012 Document Reviewed: 07/04/2012 [facility] Patient Information 2015 [place], [place]. This information is not intended to replace advice given to you by your health care provider. Make sure you discuss any questions you have with your health care provider.  Diabetes and Exercise Diabetes mellitus is a common, chronic disease, in which the pancreas is unable to adequately control blood glucose (sugar) levels. There are 2 types of diabetes. Type 1 diabetes patients are unable to produce insulin, a hormone that causes sugar in the blood to be stored in the body. People with type 1 diabetes may compensate by giving themselves injections of insulin. Type 2 diabetes involves not producing adequate amounts of insulin to control blood glucose levels. People with  type 2 diabetes control their blood glucose by monitoring their food intake or by taking medicine. Exercise is an important part of diabetes treatment. During exercise, the muscles use a greater amount of glucose from the blood for energy. This lowers your blood glucose, which is the same effect you would get from taking insulin. It has been shown that endurance athletes are more sensitive to insulin than inactive people. SYMPTOMS  Many people with a mild case of diabetes have no symptoms. However, if left uncontrolled, diabetes can lead to several complications that could be prevented with treatment of the disease. General symptoms of diabetes include:   Frequent urination (polyuria).  Frequent thirst and drinking (polydipsia).  Increased food consumption (polyphagia).  Fatigue.  Poor exercise performance.  Blurred vision.  Inflammation of the vagina (vaginitis) caused by fungal infections.  Skin infections (uncommon).  Numbness in the feet, caused by nerve injury.  Kidney disease. CAUSES  The cause of most cases of diabetes is unknown. In children, diabetes is often due to an autoimmune response to the cells in the pancreas that make insulin. It is also linked with other diseases, such as cystic fibrosis. Diabetes may have a genetic link. PREVENTION  Athletes should strive to begin exercise with blood glucose in a well-controlled state.  Feet should always be kept clean and dry.  Activities in which low blood sugar levels cannot be treated easily (scuba diving, rock climbing, swimming) should be avoided.  Anticipate alterations in diet or training to avoid low blood sugar (hypoglycemia) and high blood sugar (hyperglycemia).  Athletes should try to increase sugar consumption after strenuous exercise to avoid hypoglycemia.  Short-acting insulin should not be injected into an actively exercising muscle. The athlete should rest the injection site for about 1 hour after  exercise.  Patients with diabetes should get routine checkups of the feet to prevent complications. PROGNOSIS  Exercise provides many benefits to the person with diabetes:   Reduced body fat.  Lower blood pressure.  Often, reduced need for medicines.  Improved exercise tolerance.  Lower insulin levels.  Weight loss.  Improved lipid profile (decreased cholesterol and low-density lipoproteins). RELATED COMPLICATIONS  If performed incorrectly, exercise can result in complications of diabetes:   Poor control of blood sugar, when exercise is performed at the wrong time.  Increase in renal disease, from loss of body fluids (dehydration).  Increased risk of nerve injury (neuropathy) when performing exercises that increase foot injury.  Increased risk of eye problems when performing activities that involve breath holding or lowering or jarring the head.  Increased  risk of sudden death from exercise in patients with heart disease.  Worsening of hypertension with heavy lifting (more than 10 lb/4.5 kg). Altered blood glucose and insulin dose as a result of mild illness that produces loss of appetite.  Altered uptake of insulin after injection when insulin injection site is changed. NOTE: Exercise can lower blood glucose effectively, but the effects are short-lasting (no more than a couple of days). Exercise has been shown to improve your sensitivity to insulin. This may alter how your body responds to a given dose of injected insulin. It is important for every patient with diabetes to know how his or her body may react to exercise, and to adjust insulin dosages accordingly. TREATMENT  Eat about 1 to 3 hours before exercise.  Check blood glucose immediately before and after exercise.  Stop exercise if blood glucose is more than 250 mg/dL.  Stop exercise if blood glucose is less than 100 mg/dL.  Do not exercise within 1 hour of an insulin injection.  Be prepared to treat low blood  glucose while exercising. Keep some sugar product with you, such as a candy bar.  For prolonged exercise, use a sports drink to maintain your glucose level.  Replace used-up glucose in the body after exercise.  Consume fluids during and after exercise to avoid dehydration. SEEK MEDICAL CARE IF:  You have vision changes after a run.  You notice a loss of sensation in your feet after exercise.  You have increased numbness, tingling, or pins and needles sensations after exercise.  You have chest pain during or after exercise.  You have a fast, irregular heartbeat (palpitations) during or after exercise.  Your exercise tolerance gets worse.  You have fainting or dizzy spells for brief periods during or after exercise. Document Released: 01/25/2005 Document Revised: 06/11/2013 Document Reviewed: 05/09/2008 [facility] Patient Information 2015 [place], [place]. This information is not intended to replace advice given to you by your health care provider. Make sure you discuss any questions you have with your health care provider.  Blood Glucose Monitoring Monitoring your blood glucose (also know as blood sugar) helps you to manage your diabetes. It also helps you and your health care provider monitor your diabetes and determine how well your treatment plan is working. WHY SHOULD YOU MONITOR YOUR BLOOD GLUCOSE?  It can help you understand how food, exercise, and medicine affect your blood glucose.  It allows you to know what your blood glucose is at any given moment. You can quickly tell if you are having low blood glucose (hypoglycemia) or high blood glucose (hyperglycemia).  It can help you and your health care provider know how to adjust your medicines.  It can help you understand how to manage an illness or adjust medicine for exercise. WHEN SHOULD YOU TEST? Your health care provider will help you decide how often you should check your blood glucose. This may depend on the type of  diabetes you have, your diabetes control, or the types of medicines you are taking. Be sure to write down all of your blood glucose readings so that this information can be reviewed with your health care provider. See below for examples of testing times that your health care provider may suggest. Type 1 Diabetes  Test 4 times a day if you are in good control, using an insulin pump, or perform multiple daily injections.  If your diabetes is not well controlled or if you are sick, you may need to monitor more often.  It  is a good idea to also monitor:  Before and after exercise.  Between meals and 2 hours after a meal.  Occasionally between 2:00 a.m. and 3:00 a.m. Type 2 Diabetes  It can vary with each person, but generally, if you are on insulin, test 4 times a day.  If you take medicines by mouth (orally), test 2 times a day.  If you are on a controlled diet, test once a day.  If your diabetes is not well controlled or if you are sick, you may need to monitor more often. HOW TO MONITOR YOUR BLOOD GLUCOSE Supplies Needed  Blood glucose meter.  Test strips for your meter. Each meter has its own strips. You must use the strips that go with your own meter.  A pricking needle (lancet).  A device that holds the lancet (lancing device).  A journal or log book to write down your results. Procedure  Wash your hands with soap and water. Alcohol is not preferred.  Prick the side of your finger (not the tip) with the lancet.  Gently milk the finger until a small drop of blood appears.  Follow the instructions that come with your meter for inserting the test strip, applying blood to the strip, and using your blood glucose meter. Other Areas to Get Blood for Testing Some meters allow you to use other areas of your body (other than your finger) to test your blood. These areas are called alternative sites. The most common alternative sites are:  The forearm.  The thigh.  The back  area of the lower leg.  The palm of the hand. The blood flow in these areas is slower. Therefore, the blood glucose values you get may be delayed, and the numbers are different from what you would get from your fingers. Do not use alternative sites if you think you are having hypoglycemia. Your reading will not be accurate. Always use a finger if you are having hypoglycemia. Also, if you cannot feel your lows (hypoglycemia unawareness), always use your fingers for your blood glucose checks. ADDITIONAL TIPS FOR GLUCOSE MONITORING  Do not reuse lancets.  Always carry your supplies with you.  All blood glucose meters have a 24-hour "hotline" number to call if you have questions or need help.  Adjust (calibrate) your blood glucose meter with a control solution after finishing a few boxes of strips. BLOOD GLUCOSE RECORD KEEPING It is a good idea to keep a daily record or log of your blood glucose readings. Most glucose meters, if not all, keep your glucose records stored in the meter. Some meters come with the ability to download your records to your home computer. Keeping a record of your blood glucose readings is especially helpful if you are wanting to look for patterns. Make notes to go along with the blood glucose readings because you might forget what happened at that exact time. Keeping good records helps you and your health care provider to work together to achieve good diabetes management.  Document Released: 01/28/2003 Document Revised: 06/11/2013 Document Reviewed: 06/19/2012 [facility] Patient Information 2015 [place], [place]. This information is not intended to replace advice given to you by your health care provider. Make sure you discuss any questions you have with your health care provider. These make sure you to make an appointment with your new primary care physician.  You've been given a prescription for metformin.  Please fill this at today and take at regular basis U been given  instructions on diabetes and  exercise.  A foot care, and glucose monitoring.

## 2013-12-16 NOTE — ED Provider Notes (Addendum)
CSN: [id number]     Arrival date & time 12/16/13  0116 History   First MD Initiated Contact with Patient 12/16/13 0151     Chief Complaint  Patient presents with  . Hyperglycemia     (Consider location/radiation/quality/duration/timing/severity/associated sxs/prior Treatment) HPI Comments: This 38 year old non-insulin-dependent diabetic who has not been able to afford his medication for the past several months.  He reports that for the past several weeks he has had a mild frontal headache last night being slightly worse.  She states he is now regained employment and regained a primary care physician.  He has his first appointment next week.  He will be able to afford his medication from this point forward He denies any nausea, vomiting, diarrhea.  He does state that he is been excessively thirsty and drinking large amounts of fluid, as well as urinating large amounts of urine.  Denies any weight loss, dizziness, trauma  Patient is a 38 y.o. male presenting with hyperglycemia. The history is provided by the patient.  Hyperglycemia Blood sugar level PTA:  Unchecked Severity:  Unable to specify Onset quality:  Unable to specify Timing:  Constant Chronicity:  New Diabetes status:  Controlled with oral medications Context: noncompliance   Relieved by:  Nothing Ineffective treatments:  None tried Associated symptoms: no chest pain, no dizziness, no fever, no nausea and no vomiting   Associated symptoms comment:  Headache   Past Medical History  Diagnosis Date  . Hypertension   . Diabetes mellitus   . Allergy   . Asthma    Past Surgical History  Procedure Laterality Date  . Appendectomy    . Knee surgery    . Hernia repair    . Fracture surgery     Family History  Problem Relation Age of Onset  . Diabetes Mother   . Hypertension Mother   . Kidney disease Father   . Hypertension Father   . Diabetes Maternal Grandmother   . Hypertension Maternal Grandmother   . Stroke  Maternal Grandmother   . Kidney disease Maternal Grandmother   . Depression Brother    History  Substance Use Topics  . Smoking status: Never Smoker   . Smokeless tobacco: Not on file  . Alcohol Use: Yes    Review of Systems  Constitutional: Negative for fever and appetite change.  Respiratory: Negative for cough.   Cardiovascular: Negative for chest pain.  Gastrointestinal: Negative for nausea, vomiting and diarrhea.  Musculoskeletal: Negative for myalgias and arthralgias.  Skin: Negative for wound.  Neurological: Positive for headaches. Negative for dizziness.      Allergies  Bee venom and Penicillins  Home Medications   Prior to Admission medications   Medication Sig Start Date End Date Taking? Authorizing Provider  acetaminophen (TYLENOL) 500 MG tablet Take 1,000 mg by mouth every 6 (six) hours as needed for mild pain or headache. For pain.   Yes Historical Provider, MD  insulin glargine (LANTUS) 100 UNIT/ML injection Inject 20 Units into the skin at bedtime.   Yes Historical Provider, MD  metFORMIN (GLUCOPHAGE) 1000 MG tablet Take 1 tablet (1,000 mg total) by mouth 2 (two) times daily with a meal. 02/27/12  Yes Elvina SidleKurt Lauenstein, MD   BP 143/92 mmHg  Pulse 80  Temp(Src) 97.7 F (36.5 C) (Oral)  Resp 20  Ht 5\' 11"  (1.803 m)  Wt 265 lb (120.203 kg)  BMI 36.98 kg/m2  SpO2 97% Physical Exam  Constitutional: He is oriented to person, place, and time. He appears  well-developed and well-nourished.  HENT:  Head: Normocephalic.  Eyes: Pupils are equal, round, and reactive to light.  Neck: Normal range of motion.  Cardiovascular: Normal rate.   Pulmonary/Chest: Effort normal.  Abdominal: Soft. He exhibits no distension. There is no tenderness.  Musculoskeletal: Normal range of motion.  Neurological: He is alert and oriented to person, place, and time.  Nursing note and vitals reviewed.   ED Course  Procedures (including critical care time) Labs Review Labs Reviewed   CBC WITH DIFFERENTIAL - Abnormal; Notable for the following:    MCV 77.4 (*)    All other components within normal limits  BASIC METABOLIC PANEL - Abnormal; Notable for the following:    Sodium 135 (*)    Chloride 95 (*)    Glucose, Bld 557 (*)    GFR calc non Af Amer 66 (*)    GFR calc Af Amer 77 (*)    All other components within normal limits  CBG MONITORING, ED - Abnormal; Notable for the following:    Glucose-Capillary 568 (*)    All other components within normal limits  CBG MONITORING, ED - Abnormal; Notable for the following:    Glucose-Capillary 455 (*)    All other components within normal limits    Imaging Review No results found.   EKG Interpretation None     Labs have been reviewed.  Patient is hyperglycemic without acidosis.  He will be given IV fluids, first dose at his normal by mouth metformin and his blood glucose will be monitored MDM   Final diagnoses:  None         Arman FilterGail K Deone Omahoney, NP 12/16/13 0515  April K Palumbo-Rasch, MD 12/16/13 0522  Arman FilterGail K Corin Formisano, NP 12/16/13 0559  April K Palumbo-Rasch, MD 12/16/13 [phone]

## 2014-01-16 IMAGING — US US SCROTUM
1 series · 14 of 25 positions shown · non-contrast
Comparison: None.

CLINICAL DATA: Right testicular swelling/pain, tick bite, fever

SCROTAL ULTRASOUND
DOPPLER ULTRASOUND OF THE TESTICLES
TECHNIQUE: Complete ultrasound examination of the testicles,
epididymis, and other scrotal structures was performed.  Color and
spectral Doppler ultrasound were also utilized to evaluate blood
flow to the testicles.

[Series 1: us scrotum · 0.09mm/px · 59 acquisitions, 14 frames shown]
[im 1/59]
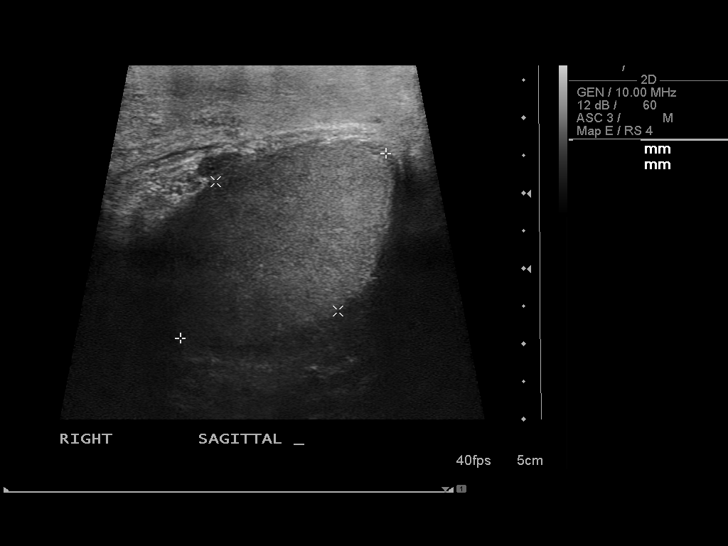
[im 5/59]
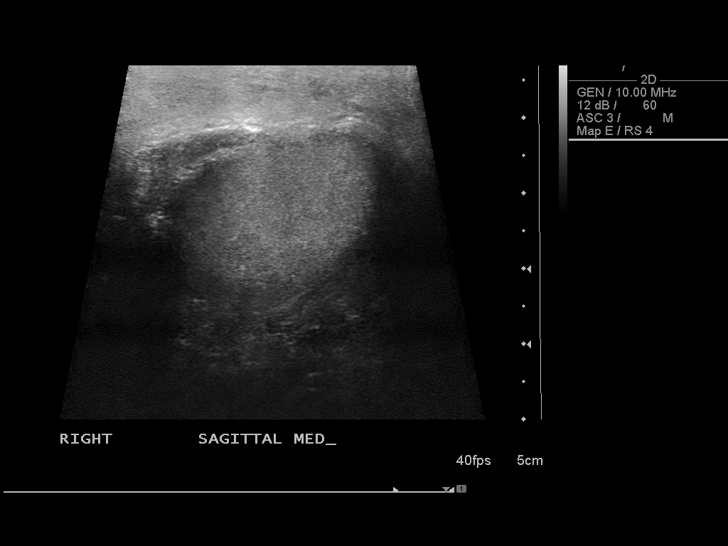
[im 10/59]
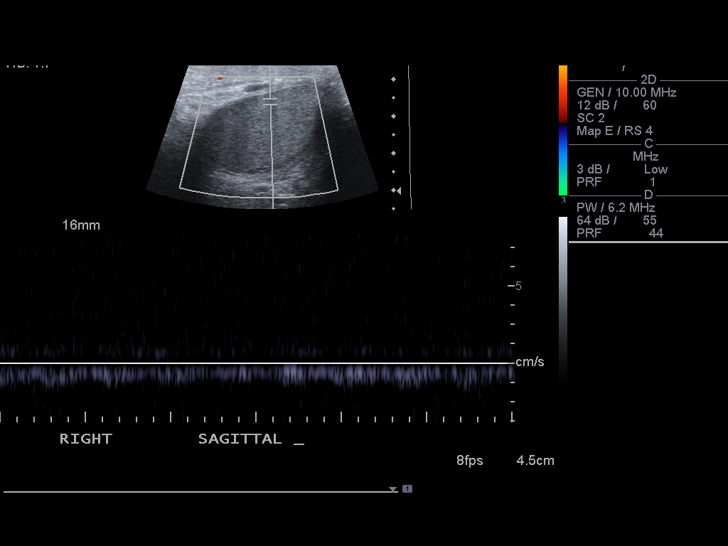
[im 15/59]
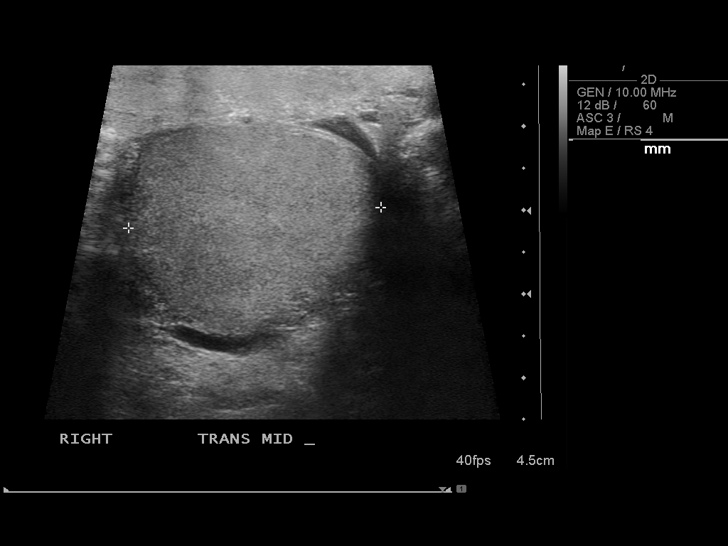
[im 20/59]
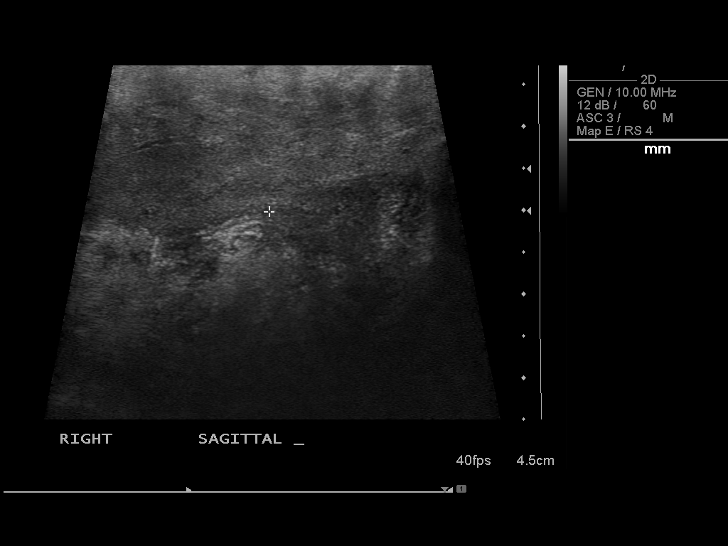
[im 22/59]
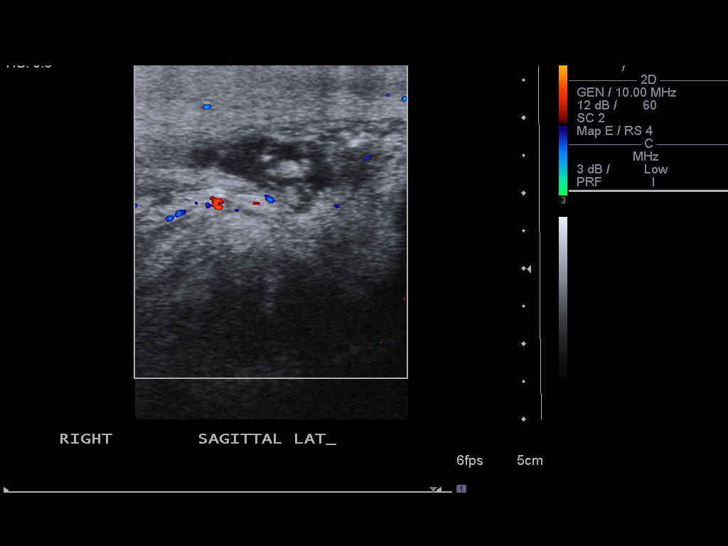
[im 27/59]
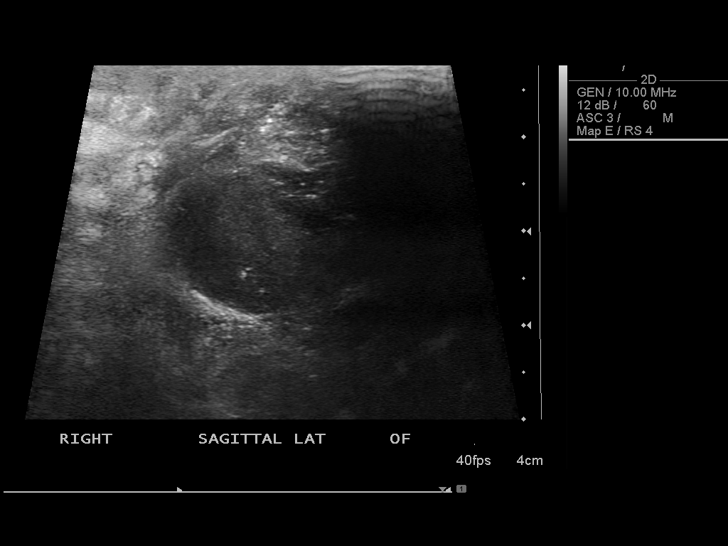
[im 32/59]
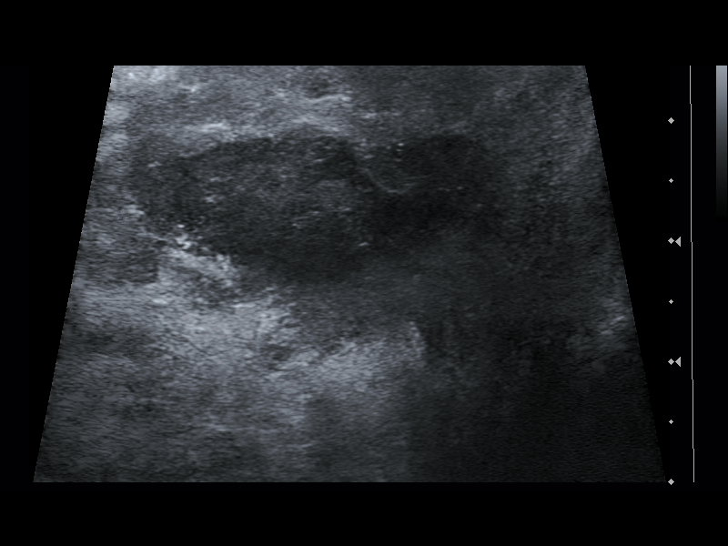
[im 37/59]
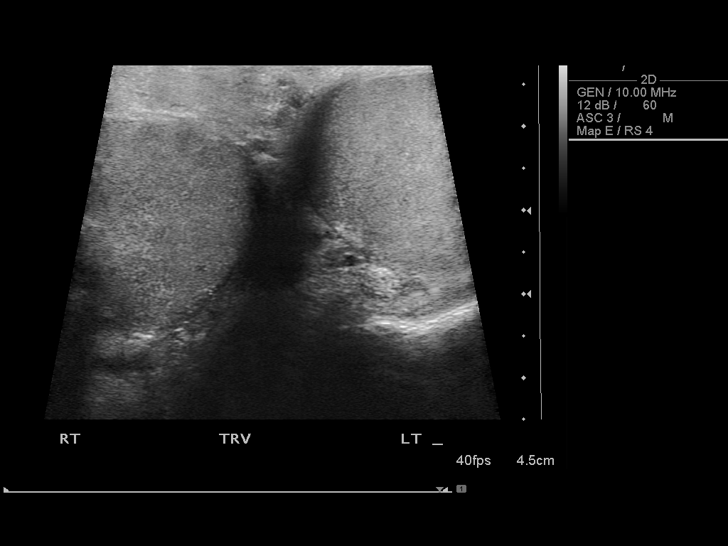
[im 39/59]
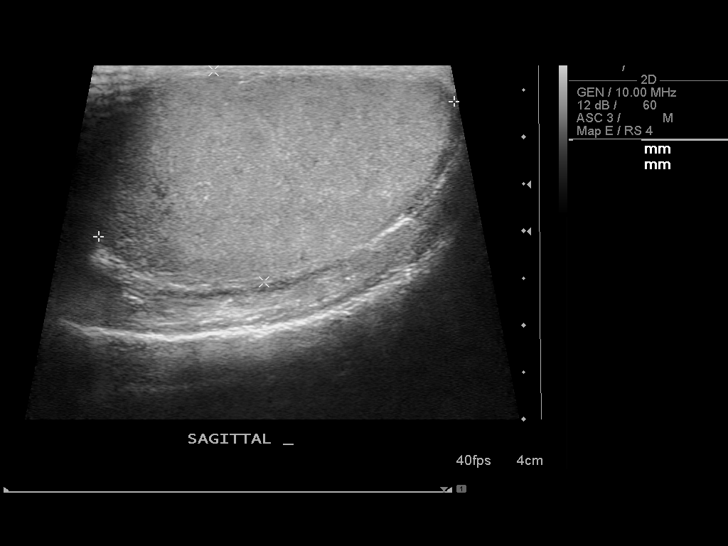
[im 44/59]
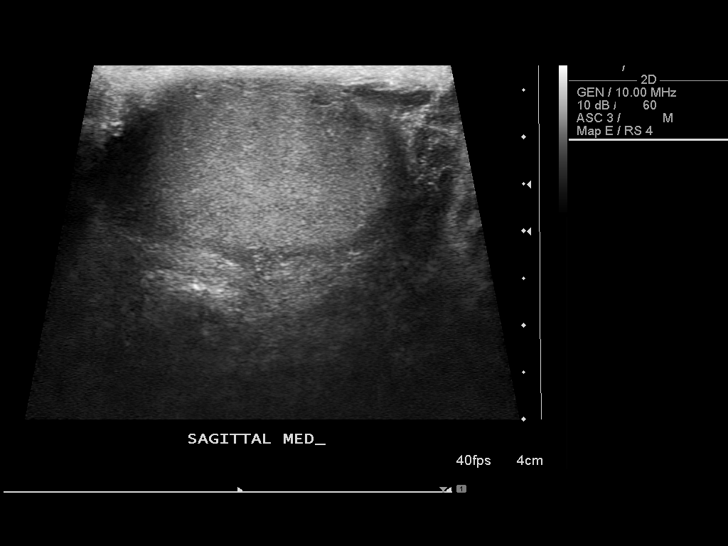
[im 49/59]
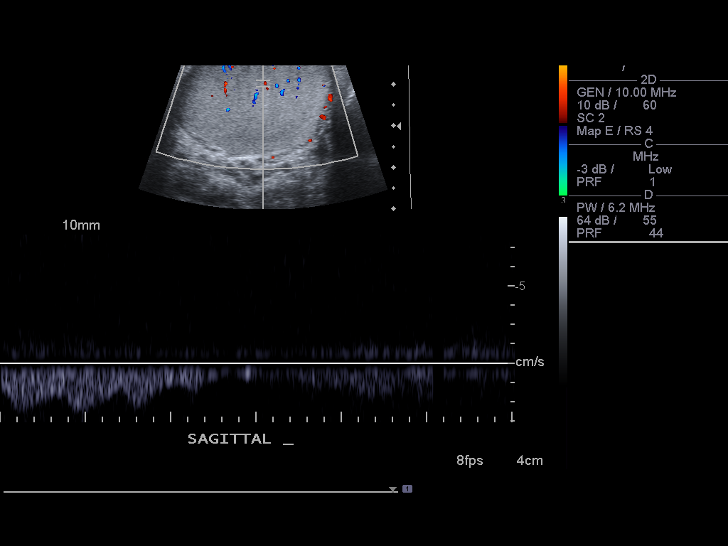
[im 54/59]
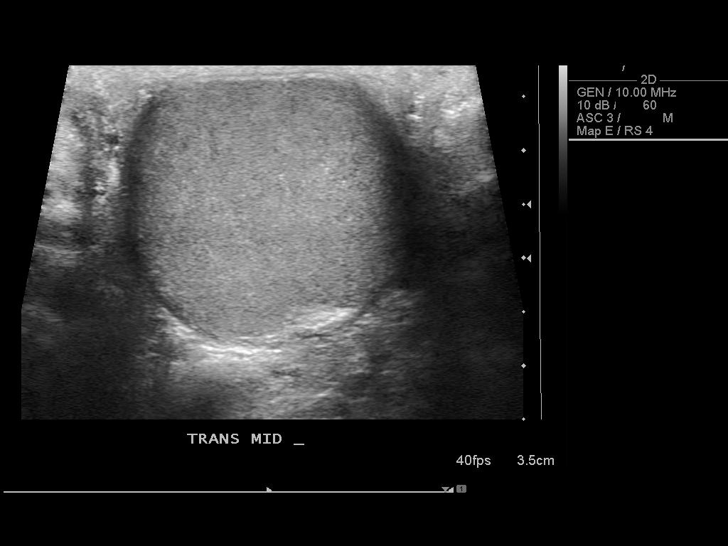
[im 59/59]
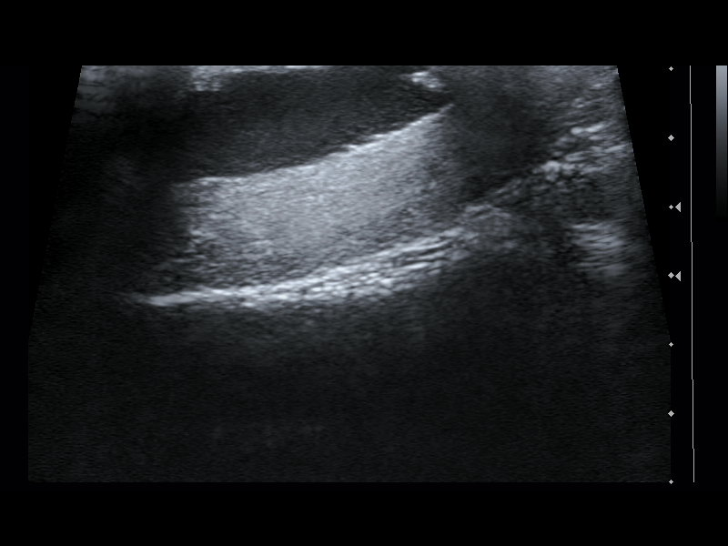

[14 of 25 positions shown; findings below may reference images not displayed]

FINDINGS: Right testis:  Normal in size and appearance, measuring 3.7 x 2.4 x
3.0 cm.

Left testis:  Normal in size and appearance, measuring 4.0 x 2.3 x
2.6 cm.

Right epididymis:  Not well visualized.

Left epididymis:  Normal in size and appearance.

Hydrocele:  Absent

Varicocele:  Absent

Pulsed Doppler interrogation of both testes demonstrates low
resistance flow bilaterally.

Scrotal wall thickening with associated 1.6 x 3.2 x 1.8 cm complex
fluid collection/abscess along the lateral aspect of the right
scrotum.
IMPRESSION: No evidence of testicular torsion.

Suspected 3.2 cm right scrotal wall abscess.  Associated scrotal
wall thickening.

## 2015-06-23 ENCOUNTER — Emergency Department (HOSPITAL_BASED_OUTPATIENT_CLINIC_OR_DEPARTMENT_OTHER)
Admission: EM | Admit: 2015-06-23 | Discharge: 2015-06-23 | Disposition: A | Discharge status: Home / Self Care | Payer: Managed Care, Other (non HMO) | Attending: Emergency Medicine | Admitting: Emergency Medicine

## 2015-06-23 ENCOUNTER — Encounter (HOSPITAL_BASED_OUTPATIENT_CLINIC_OR_DEPARTMENT_OTHER): Payer: Self-pay | Admitting: Emergency Medicine

## 2015-06-23 DIAGNOSIS — J45909 Unspecified asthma, uncomplicated: Secondary | ICD-10-CM | POA: Diagnosis not present

## 2015-06-23 DIAGNOSIS — I10 Essential (primary) hypertension: Secondary | ICD-10-CM | POA: Insufficient documentation

## 2015-06-23 DIAGNOSIS — R059 Cough, unspecified: Secondary | ICD-10-CM

## 2015-06-23 DIAGNOSIS — R05 Cough: Secondary | ICD-10-CM | POA: Diagnosis present

## 2015-06-23 DIAGNOSIS — E119 Type 2 diabetes mellitus without complications: Secondary | ICD-10-CM | POA: Diagnosis not present

## 2015-06-23 DIAGNOSIS — Z79899 Other long term (current) drug therapy: Secondary | ICD-10-CM | POA: Diagnosis not present

## 2015-06-23 LAB — CBG MONITORING, ED: GLUCOSE-CAPILLARY: 314 mg/dL — AB (ref 65–99)

## 2015-06-23 MED ORDER — ALBUTEROL SULFATE HFA 108 (90 BASE) MCG/ACT IN AERS
2.0000 | INHALATION_SPRAY | Freq: Once | RESPIRATORY_TRACT | Status: AC
  Administered 2015-06-23: 2 via RESPIRATORY_TRACT
  Filled 2015-06-23: qty 6.7

## 2015-06-23 MED ORDER — OMEPRAZOLE 20 MG PO CPDR: 20.0000 mg | DELAYED_RELEASE_CAPSULE | Freq: Every day | ORAL | Status: DC

## 2015-06-23 MED ORDER — GI COCKTAIL ~~LOC~~
30.0000 mL | Freq: Once | ORAL | Status: AC
  Administered 2015-06-23: 30 mL via ORAL
  Filled 2015-06-23: qty 30

## 2015-06-23 NOTE — ED Notes (Signed)
RT into room 

## 2015-06-23 NOTE — Discharge Instructions (Signed)

## 2015-06-23 NOTE — ED Notes (Signed)
Pt alert, NAD, calm, interactive, no dyspnea noted, c/o persistent nagging non-productive cough, post nasal drainage, (denies: cough production/ sputum, fever, nvd, sore throat, dizziness or other sx. [facility]

## 2015-06-23 NOTE — ED Notes (Signed)
Dr. Campos into room 

## 2015-06-23 NOTE — ED Notes (Signed)
Dr. Patria Maneampos into room prior to RN assessment, see MD notes, pending orders.

## 2015-06-23 NOTE — ED Provider Notes (Signed)
CSN: [id number]     Arrival date & time 06/23/15  0035 History  By signing my name below, I, Ronney LionSuzanne Le, attest that this documentation has been prepared under the direction and in the presence of Azalia BilisKevin Torryn Fiske, MD. Electronically Signed: Ronney LionSuzanne Le, ED Scribe. 06/23/2015. 1:00 AM.     Chief Complaint  Patient presents with  . Cough   The history is provided by the patient. No language interpreter was used.    HPI Comments: Timothy Heath is a 40 y.o. male who presents to the Emergency Department complaining of a constant, moderate cough that began earlier today. He reports he feels like there is drainage in his throat, causing him to cough. Patient reports he otherwise has been well. He states he last ate at 10 PM. He denies any known sick contact. He denies any exposures to perfumes, smokes, or any other strong fumes. Patient notes a history of asthma when he was younger, although he has not had exacerbations in recent years. Patient reports he takes metformin, Invokana, and Lantus for his DM.  He denies fever, ear symptoms, or nasal symptoms.     Past Medical History  Diagnosis Date  . Hypertension   . Diabetes mellitus   . Allergy   . Asthma    Past Surgical History  Procedure Laterality Date  . Appendectomy    . Knee surgery    . Hernia repair    . Fracture surgery     Family History  Problem Relation Age of Onset  . Diabetes Mother   . Hypertension Mother   . Kidney disease Father   . Hypertension Father   . Diabetes Maternal Grandmother   . Hypertension Maternal Grandmother   . Stroke Maternal Grandmother   . Kidney disease Maternal Grandmother   . Depression Brother    Social History  Substance Use Topics  . Smoking status: Never Smoker   . Smokeless tobacco: None  . Alcohol Use: Yes    Review of Systems  Constitutional: Negative for fever.  HENT: Negative for congestion, ear discharge, ear pain, nosebleeds and rhinorrhea.   Respiratory: Positive for cough.    All other systems reviewed and are negative.   Allergies  Bee venom and Penicillins  Home Medications   Prior to Admission medications   Medication Sig Start Date End Date Taking? Authorizing Provider  Canagliflozin (INVOKANA PO) Take by mouth.   Yes Historical Provider, MD  acetaminophen (TYLENOL) 500 MG tablet Take 1,000 mg by mouth every 6 (six) hours as needed for mild pain or headache. For pain.    Historical Provider, MD  insulin glargine (LANTUS) 100 UNIT/ML injection Inject 20 Units into the skin at bedtime.    Historical Provider, MD  metFORMIN (GLUCOPHAGE) 1000 MG tablet Take 1 tablet (1,000 mg total) by mouth 2 (two) times daily with a meal. 12/16/13   Earley FavorGail Schulz, NP   BP 153/91 mmHg  Pulse 77  Temp(Src) 98.7 F (37.1 C) (Oral)  Resp 22  Ht 5\' 11"  (1.803 m)  Wt 270 lb (122.471 kg)  BMI 37.67 kg/m2  SpO2 97% Physical Exam  Constitutional: He is oriented to person, place, and time. He appears well-developed and well-nourished.  HENT:  Head: Normocephalic and atraumatic.  Mouth/Throat: No oropharyngeal exudate.  Normal dentition. Uvula midline. No tonsillar swelling or exudate.   Eyes: EOM are normal.  Neck: Normal range of motion. No tracheal deviation present. No thyromegaly present.  Cardiovascular: Normal rate, regular rhythm, normal heart sounds  and intact distal pulses.   Pulmonary/Chest: Effort normal and breath sounds normal. No stridor. No respiratory distress.  Abdominal: Soft. He exhibits no distension. There is no tenderness.  Musculoskeletal: Normal range of motion.  Lymphadenopathy:    He has no cervical adenopathy.  Neurological: He is alert and oriented to person, place, and time.  Skin: Skin is warm and dry.  Psychiatric: He has a normal mood and affect. Judgment normal.  Nursing note and vitals reviewed.   ED Course  Procedures (including critical care time)  DIAGNOSTIC STUDIES: Oxygen Saturation is 97% on RA, normal by my interpretation.     COORDINATION OF CARE: 12:57 AM - Discussed treatment plan with pt at bedside which includes albuterol inhaler and GI cocktail. Pt verbalized understanding and agreed to plan.   Labs Review Labs Reviewed  CBG MONITORING, ED - Abnormal; Notable for the following:    Glucose-Capillary 314 (*)    All other components within normal limits    Imaging Review No results found. I have personally reviewed and evaluated these images and lab results as part of my medical decision-making. MDM   Final diagnoses:  Cough   Well-appearing.  Nonproductive cough.  Bronchospasm versus gastroesophageal reflux disease.  Some improvement of GI cocktail.  Patient be placed on Prilosec daily.  Primary care follow-up.  Nonproductive cough.  No hypoxia.  Vital signs are normal.  Doubt pneumonia.  Doubt PE.  No indication for chest x-ray.  Overall well-appearing.  No increased work of breathing.   I personally performed the services described in this documentation, which was scribed in my presence. The recorded information has been reviewed and is accurate.        Azalia Bilis, MD 06/23/15 [phone]

## 2015-06-23 NOTE — ED Notes (Signed)
RT at BS.

## 2015-06-23 NOTE — ED Notes (Signed)
Patient states that he started to have a drainage to his throat and this caused him to cough. Reports that he has been coughing x 2 hours straight with no relief. Reports that he is having pain to his chest now with the cough

## 2015-10-02 ENCOUNTER — Encounter (HOSPITAL_BASED_OUTPATIENT_CLINIC_OR_DEPARTMENT_OTHER): Payer: Self-pay | Admitting: *Deleted

## 2015-10-02 ENCOUNTER — Emergency Department (HOSPITAL_BASED_OUTPATIENT_CLINIC_OR_DEPARTMENT_OTHER)
Admission: EM | Admit: 2015-10-02 | Discharge: 2015-10-02 | Disposition: A | Discharge status: Home / Self Care | Payer: Managed Care, Other (non HMO) | Source: Home / Self Care | Attending: Emergency Medicine | Admitting: Emergency Medicine

## 2015-10-02 ENCOUNTER — Encounter (HOSPITAL_BASED_OUTPATIENT_CLINIC_OR_DEPARTMENT_OTHER): Payer: Self-pay

## 2015-10-02 ENCOUNTER — Emergency Department (HOSPITAL_BASED_OUTPATIENT_CLINIC_OR_DEPARTMENT_OTHER)
Admission: EM | Admit: 2015-10-02 | Discharge: 2015-10-03 | Disposition: A | Discharge status: Home / Self Care | Payer: Managed Care, Other (non HMO) | Source: Home / Self Care | Attending: Emergency Medicine | Admitting: Emergency Medicine

## 2015-10-02 DIAGNOSIS — R739 Hyperglycemia, unspecified: Secondary | ICD-10-CM

## 2015-10-02 DIAGNOSIS — R112 Nausea with vomiting, unspecified: Principal | ICD-10-CM | POA: Insufficient documentation

## 2015-10-02 DIAGNOSIS — Z794 Long term (current) use of insulin: Secondary | ICD-10-CM | POA: Insufficient documentation

## 2015-10-02 DIAGNOSIS — Z9119 Patient's noncompliance with other medical treatment and regimen: Secondary | ICD-10-CM | POA: Diagnosis not present

## 2015-10-02 DIAGNOSIS — B349 Viral infection, unspecified: Secondary | ICD-10-CM | POA: Insufficient documentation

## 2015-10-02 DIAGNOSIS — Z7984 Long term (current) use of oral hypoglycemic drugs: Secondary | ICD-10-CM

## 2015-10-02 DIAGNOSIS — E1165 Type 2 diabetes mellitus with hyperglycemia: Secondary | ICD-10-CM

## 2015-10-02 DIAGNOSIS — I10 Essential (primary) hypertension: Secondary | ICD-10-CM

## 2015-10-02 DIAGNOSIS — J45909 Unspecified asthma, uncomplicated: Secondary | ICD-10-CM

## 2015-10-02 DIAGNOSIS — G43909 Migraine, unspecified, not intractable, without status migrainosus: Secondary | ICD-10-CM | POA: Diagnosis not present

## 2015-10-02 DIAGNOSIS — R519 Headache, unspecified: Secondary | ICD-10-CM

## 2015-10-02 DIAGNOSIS — N179 Acute kidney failure, unspecified: Secondary | ICD-10-CM | POA: Diagnosis not present

## 2015-10-02 DIAGNOSIS — E869 Volume depletion, unspecified: Secondary | ICD-10-CM | POA: Insufficient documentation

## 2015-10-02 DIAGNOSIS — I129 Hypertensive chronic kidney disease with stage 1 through stage 4 chronic kidney disease, or unspecified chronic kidney disease: Secondary | ICD-10-CM | POA: Insufficient documentation

## 2015-10-02 DIAGNOSIS — N182 Chronic kidney disease, stage 2 (mild): Secondary | ICD-10-CM | POA: Insufficient documentation

## 2015-10-02 DIAGNOSIS — E1122 Type 2 diabetes mellitus with diabetic chronic kidney disease: Secondary | ICD-10-CM | POA: Insufficient documentation

## 2015-10-02 DIAGNOSIS — R51 Headache: Secondary | ICD-10-CM

## 2015-10-02 LAB — CBG MONITORING, ED
GLUCOSE-CAPILLARY: 324 mg/dL — AB (ref 65–99)
Glucose-Capillary: 349 mg/dL — ABNORMAL HIGH (ref 65–99)
Glucose-Capillary: 312 mg/dL — ABNORMAL HIGH (ref 65–99)

## 2015-10-02 LAB — CBC WITH DIFFERENTIAL/PLATELET
BASOS ABS: 0 10*3/uL (ref 0.0–0.1)
Basophils Relative: 0 %
EOS PCT: 0 %
Eosinophils Absolute: 0 10*3/uL (ref 0.0–0.7)
HEMATOCRIT: 41.7 % (ref 39.0–52.0)
HEMOGLOBIN: 14.4 g/dL (ref 13.0–17.0)
LYMPHS PCT: 11 %
Lymphs Abs: 1.1 10*3/uL (ref 0.7–4.0)
MCH: 25.8 pg — ABNORMAL LOW (ref 26.0–34.0)
MCHC: 34.5 g/dL (ref 30.0–36.0)
MCV: 74.6 fL — AB (ref 78.0–100.0)
MONOS PCT: 5 %
Monocytes Absolute: 0.5 10*3/uL (ref 0.1–1.0)
NEUTROS ABS: 8.3 10*3/uL — AB (ref 1.7–7.7)
Neutrophils Relative %: 84 %
Platelets: 212 10*3/uL (ref 150–400)
RBC: 5.59 MIL/uL (ref 4.22–5.81)
RDW: 13.7 % (ref 11.5–15.5)
WBC: 9.9 10*3/uL (ref 4.0–10.5)

## 2015-10-02 LAB — COMPREHENSIVE METABOLIC PANEL
ALBUMIN: 3.6 g/dL (ref 3.5–5.0)
ALK PHOS: 59 U/L (ref 38–126)
ALT: 23 U/L (ref 17–63)
AST: 22 U/L (ref 15–41)
Anion gap: 12 (ref 5–15)
BILIRUBIN TOTAL: 0.9 mg/dL (ref 0.3–1.2)
BUN: 14 mg/dL (ref 6–20)
CALCIUM: 8.9 mg/dL (ref 8.9–10.3)
CO2: 23 mmol/L (ref 22–32)
Chloride: 101 mmol/L (ref 101–111)
Creatinine, Ser: 1.39 mg/dL — ABNORMAL HIGH (ref 0.61–1.24)
GFR calc Af Amer: >60 mL/min (ref >60)
GLUCOSE: 312 mg/dL — AB (ref 65–99)
Potassium: 4.5 mmol/L (ref 3.5–5.1)
Sodium: 136 mmol/L (ref 135–145)
TOTAL PROTEIN: 7 g/dL (ref 6.5–8.1)

## 2015-10-02 LAB — LIPASE, BLOOD: Lipase: 39 U/L (ref 11–51)

## 2015-10-02 MED ORDER — KETOROLAC TROMETHAMINE 30 MG/ML IJ SOLN
30.0000 mg | Freq: Once | INTRAMUSCULAR | Status: AC
Start: 2015-10-02 — End: 2015-10-02
  Administered 2015-10-02: 30 mg via INTRAVENOUS
  Filled 2015-10-02: qty 1

## 2015-10-02 MED ORDER — ONDANSETRON HCL 4 MG/2ML IJ SOLN
4.0000 mg | Freq: Once | INTRAMUSCULAR | Status: AC
  Administered 2015-10-02: 4 mg via INTRAVENOUS
  Filled 2015-10-02: qty 2

## 2015-10-02 MED ORDER — INSULIN ASPART 100 UNIT/ML ~~LOC~~ SOLN
10.0000 [IU] | Freq: Once | SUBCUTANEOUS | Status: AC
  Administered 2015-10-02: 10 [IU] via SUBCUTANEOUS
  Filled 2015-10-02: qty 1

## 2015-10-02 MED ORDER — INSULIN ASPART 100 UNIT/ML ~~LOC~~ SOLN
6.0000 [IU] | Freq: Once | SUBCUTANEOUS | Status: AC
  Administered 2015-10-02: 6 [IU] via SUBCUTANEOUS
  Filled 2015-10-02: qty 1

## 2015-10-02 MED ORDER — SODIUM CHLORIDE 0.9 % IV BOLUS (SEPSIS)
1000.0000 mL | Freq: Once | INTRAVENOUS | Status: AC
  Administered 2015-10-02: 1000 mL via INTRAVENOUS

## 2015-10-02 MED ORDER — KETOROLAC TROMETHAMINE 15 MG/ML IJ SOLN
15.0000 mg | Freq: Once | INTRAMUSCULAR | Status: AC
  Administered 2015-10-02: 15 mg via INTRAVENOUS
  Filled 2015-10-02: qty 1

## 2015-10-02 MED ORDER — DIPHENHYDRAMINE HCL 50 MG/ML IJ SOLN
25.0000 mg | Freq: Once | INTRAMUSCULAR | Status: AC
  Administered 2015-10-02: 25 mg via INTRAVENOUS
  Filled 2015-10-02: qty 1

## 2015-10-02 MED ORDER — ONDANSETRON 8 MG PO TBDP: 8.0000 mg | ORAL_TABLET | Freq: Three times a day (TID) | ORAL | 0 refills | Status: DC | PRN

## 2015-10-02 MED ORDER — METOCLOPRAMIDE HCL 5 MG/ML IJ SOLN
10.0000 mg | Freq: Once | INTRAMUSCULAR | Status: AC
  Administered 2015-10-02: 10 mg via INTRAVENOUS
  Filled 2015-10-02: qty 2

## 2015-10-02 NOTE — ED Provider Notes (Signed)
[facility] Provider Note   CSN: [id number] Arrival date & time: 10/02/15  2220  By signing my name below, I, Christel MormonMatthew Jamison, attest that this documentation has been prepared under the direction and in the presence of Melburn HakeNicole Belita [place], [place]. Electronically Signed: Christel MormonMatthew Jamison, Scribe. 10/02/2015. 10:51 PM.    History   Chief Complaint Chief Complaint  Patient presents with  . Vomiting    HPI Timothy Heath is a 40 y.o. male.  HPI HPI Comments:  Timothy Heath is a 40 y.o. male with PMHx of DM and HTN who presents to the Emergency Department complaining of sudden onset, constant sharp, throbbing headache and vomiting x 3:30PM yesterday. Pt visited the ED x ~18 hours, was given medication with improvement of sxs and was discharged. Pt complains of associated fever this evening (100.4), weakness, myalgias, lightheadedness, dizziness, abdominal pain, nausea, NBNB vomiting. Pt notes that headache is worsened by light. Pt takes metformin and insulin for DM at home but notes he is out of his latus. Pt took tylenol and zofran today and vomited immediately after and has not been able to keep anything down. Pt denies neck stiffness, visual changes, sore throat, cough, SOB, diarrhea, constipation, abdominal pain, dysuria, melena, hematuria, rash, swelling, hematuria, or sick contact.     Past Medical History:  Diagnosis Date  . Allergy   . Asthma   . Diabetes mellitus   . Hypertension     Patient Active Problem List   Diagnosis Date Noted  . DM 10/31/2006  . HYPERTENSION 10/31/2006  . ASTHMA 10/31/2006    Past Surgical History:  Procedure Laterality Date  . APPENDECTOMY    . FRACTURE SURGERY    . HERNIA REPAIR    . KNEE SURGERY         Home Medications    Prior to Admission medications   Medication Sig Start Date End Date Taking? Authorizing Provider  acetaminophen (TYLENOL) 500 MG tablet Take 1,000 mg by mouth every 6 (six) hours as needed for mild pain  or headache. For pain.    Historical Provider, MD  Canagliflozin (INVOKANA PO) Take by mouth.    Historical Provider, MD  insulin glargine (LANTUS) 100 UNIT/ML injection Inject 20 Units into the skin at bedtime.    Historical Provider, MD  metFORMIN (GLUCOPHAGE) 1000 MG tablet Take 1 tablet (1,000 mg total) by mouth 2 (two) times daily with a meal. 12/16/13   Earley FavorGail Schulz, NP  omeprazole (PRILOSEC) 20 MG capsule Take 1 capsule (20 mg total) by mouth daily. 06/23/15   Azalia BilisKevin Campos, MD  ondansetron (ZOFRAN ODT) 8 MG disintegrating tablet Take 1 tablet (8 mg total) by mouth every 8 (eight) hours as needed for nausea or vomiting. 10/02/15   Paula LibraJohn Molpus, MD  promethazine (PHENERGAN) 25 MG suppository Place 1 suppository (25 mg total) rectally every 6 (six) hours as needed for nausea or vomiting. 10/03/15   Barrett HenleNicole Elizabeth Kaeleen Odom, PA-C    Family History Family History  Problem Relation Age of Onset  . Diabetes Mother   . Hypertension Mother   . Kidney disease Father   . Hypertension Father   . Diabetes Maternal Grandmother   . Hypertension Maternal Grandmother   . Stroke Maternal Grandmother   . Kidney disease Maternal Grandmother   . Depression Brother     Social History Social History  Substance Use Topics  . Smoking status: Never Smoker  . Smokeless tobacco: Never Used  . Alcohol use No  Allergies   Bee venom and Penicillins   Review of Systems Review of Systems  Constitutional: Positive for fever.  HENT: Negative for sore throat.   Respiratory: Negative for cough and shortness of breath.   Gastrointestinal: Positive for nausea and vomiting. Negative for blood in stool, constipation and diarrhea.  Genitourinary: Negative for dysuria and hematuria.  Musculoskeletal: Positive for myalgias. Negative for joint swelling.  Skin: Negative for rash.  Neurological: Positive for dizziness, light-headedness and headaches.     Physical Exam Updated Vital Signs BP 150/83   Pulse 94    Temp 98.9 F (37.2 C) (Oral)   Resp 20   Ht 6' (1.829 m)   Wt 124.7 kg   SpO2 98%   BMI 37.30 kg/m   Physical Exam  Constitutional: He appears well-developed.  HENT:  Head: Normocephalic and atraumatic.  Mouth/Throat: Oropharynx is clear and moist. No oropharyngeal exudate.  Eyes: Conjunctivae and EOM are normal. Pupils are equal, round, and reactive to light. Right eye exhibits no discharge. Left eye exhibits no discharge. No scleral icterus.  Neck: Normal range of motion. Neck supple.  Cardiovascular: Normal rate, regular rhythm, normal heart sounds and intact distal pulses.   No murmur heard. Pulmonary/Chest: Effort normal and breath sounds normal. No respiratory distress. He has no wheezes. He has no rales. He exhibits no tenderness.  Abdominal: Soft. Bowel sounds are normal. He exhibits no distension and no mass. There is no tenderness. There is no rebound and no guarding. No hernia.  Musculoskeletal: Normal range of motion. He exhibits no edema.  Neurological: He is alert. He has normal strength. No cranial nerve deficit or sensory deficit. He displays a negative Romberg sign. Coordination normal.  Skin: Skin is warm and dry.  Psychiatric: He has a normal mood and affect.  Nursing note and vitals reviewed.    ED Treatments / Results  DIAGNOSTIC STUDIES:  Oxygen Saturation is 98% on RA, normal by my interpretation.    COORDINATION OF CARE:  10:51 PM Discussed treatment plan with pt at bedside and pt agreed to plan.  Labs (all labs ordered are listed, but only abnormal results are displayed) Labs Reviewed  CBC WITH DIFFERENTIAL/PLATELET - Abnormal; Notable for the following:       Result Value   MCV 74.6 (*)    MCH 25.8 (*)    Neutro Abs 8.3 (*)    All other components within normal limits  COMPREHENSIVE METABOLIC PANEL - Abnormal; Notable for the following:    Glucose, Bld 312 (*)    Creatinine, Ser 1.39 (*)    All other components within normal limits  CBG  MONITORING, ED - Abnormal; Notable for the following:    Glucose-Capillary 312 (*)    All other components within normal limits  CBG MONITORING, ED - Abnormal; Notable for the following:    Glucose-Capillary 303 (*)    All other components within normal limits  LIPASE, BLOOD    EKG  EKG Interpretation None       Radiology No results found.  Procedures Procedures (including critical care time)  Medications Ordered in ED Medications  sodium chloride 0.9 % bolus 1,000 mL (0 mLs Intravenous Stopped 10/02/15 2351)  ondansetron (ZOFRAN) injection 4 mg (4 mg Intravenous Given 10/02/15 2314)  ketorolac (TORADOL) 30 MG/ML injection 30 mg (30 mg Intravenous Given 10/02/15 2314)  insulin aspart (novoLOG) injection 6 Units (6 Units Subcutaneous Given 10/02/15 2356)  acetaminophen (TYLENOL) tablet 650 mg (650 mg Oral Given 10/03/15 0041)  Initial Impression / Assessment and Plan / ED Course  I have reviewed the triage vital signs and the nursing notes.  Pertinent labs & imaging results that were available during my care of the patient were reviewed by me and considered in my medical decision making (see chart for details).  Clinical Course   Pt presents headache, lightheadedness, nausea and vomiting. He has been unable to keep anything down at home. No relief with zofran at home. VSS. Exam unremarkable, MMM, no abdominal tenderness. Pt given IVF, zofran, toradol and tylenol.  Chart review shows pt was seen in the ED yesterday for HA and nausea/vomiting. CBG noted to be 455, pt given insulin in the ED. Pt reported being noncompliant with his metformin and notes he has not refilled his lantus. Sxs improved and pt was d/c home with zofran.   CBG 312. No anion gap. Pt given 6U novolog. Cr mildly elevated at 1.39. Remaining labs unremarkable. On reevaluation pt reports his headache and nausea have significantly improved. Will PO challenge pt. I do not suspect SAH, ICH, intracranial lesion,  meningitis, encephalopathy or encephalitis and do not think that cranial imaging is needed at this time. Pt's wife also reports that they picked up his new lantus prescription this afternoon. On reevaluation pt able to tolerate PO. Plan to d/c pt home with suppository antiemetic and symptomatic tx. advised patient to continue monitoring his glucose at home and take his metformin and Lantus as prescribed. Advised patient to follow up with his PCP in the next 3-4 days. Discussed strict return precautions with patient.  Final Clinical Impressions(s) / ED Diagnoses   Final diagnoses:  Non-intractable vomiting with nausea, vomiting of unspecified type  Nonintractable headache, unspecified chronicity pattern, unspecified headache type  Hyperglycemia    New Prescriptions New Prescriptions   PROMETHAZINE (PHENERGAN) 25 MG SUPPOSITORY    Place 1 suppository (25 mg total) rectally every 6 (six) hours as needed for nausea or vomiting.   I personally performed the services described in this documentation, which was scribed in my presence. The recorded information has been reviewed and is accurate.    Satira Sark [place], [place] 10/03/15 0051    Paula Libra, MD 10/03/15 [phone]

## 2015-10-02 NOTE — ED Notes (Signed)
Patient complains of photophobia, fever, chills and headache.  Reports nauseated at this time too.  Reports started yesterday at work.  Denies weakness or vision changes.  Denies HTN but patient is hypertensive here.

## 2015-10-02 NOTE — ED Notes (Signed)
C/o HA, also body aches and nv, mentions some recent chills. (denies: abd pain, diarrhea or bleeding).

## 2015-10-02 NOTE — ED Notes (Signed)
CBG is 312. 

## 2015-10-02 NOTE — ED Triage Notes (Signed)
Presents to Er with headache, mild fever and nausea.  Reports started yesterday afternoon at work.

## 2015-10-02 NOTE — ED Triage Notes (Signed)
Vomiting, body aches and headache. He was here earlier this am for the same.

## 2015-10-02 NOTE — ED Provider Notes (Addendum)
[facility]   CSN: [id number] Arrival date & time: 10/02/15  0416     History   Chief Complaint Chief Complaint  Patient presents with  . Headache    HPI Timothy Heath is a 40 y.o. male who has had the gradual onset of headaches that yesterday afternoon about 3:30 PM. The headache is located frontally and is throbbing. It is moderate to severe. It is been associated with generalized body aches, nausea, dry heaves, low-grade fever and malaise. He denies respiratory symptoms, visual changes or photophobia. He denies abdominal pain or diarrhea. He took some Tylenol yesterday evening about 7 PM with partial relief.  HPI  Past Medical History:  Diagnosis Date  . Allergy   . Asthma   . Diabetes mellitus   . Hypertension     Patient Active Problem List   Diagnosis Date Noted  . DM 10/31/2006  . HYPERTENSION 10/31/2006  . ASTHMA 10/31/2006    Past Surgical History:  Procedure Laterality Date  . APPENDECTOMY    . FRACTURE SURGERY    . HERNIA REPAIR    . KNEE SURGERY         Home Medications    Prior to Admission medications   Medication Sig Start Date End Date Taking? Authorizing Provider  acetaminophen (TYLENOL) 500 MG tablet Take 1,000 mg by mouth every 6 (six) hours as needed for mild pain or headache. For pain.    Historical Provider, MD  Canagliflozin (INVOKANA PO) Take by mouth.    Historical Provider, MD  insulin glargine (LANTUS) 100 UNIT/ML injection Inject 20 Units into the skin at bedtime.    Historical Provider, MD  metFORMIN (GLUCOPHAGE) 1000 MG tablet Take 1 tablet (1,000 mg total) by mouth 2 (two) times daily with a meal. 12/16/13   Earley FavorGail Schulz, NP  omeprazole (PRILOSEC) 20 MG capsule Take 1 capsule (20 mg total) by mouth daily. 06/23/15   Azalia BilisKevin Campos, MD  ondansetron (ZOFRAN ODT) 8 MG disintegrating tablet Take 1 tablet (8 mg total) by mouth every 8 (eight) hours as needed for nausea or vomiting. 10/02/15   Paula LibraJohn Lavelle Berland, MD     Family History Family History  Problem Relation Age of Onset  . Diabetes Mother   . Hypertension Mother   . Kidney disease Father   . Hypertension Father   . Diabetes Maternal Grandmother   . Hypertension Maternal Grandmother   . Stroke Maternal Grandmother   . Kidney disease Maternal Grandmother   . Depression Brother     Social History Social History  Substance Use Topics  . Smoking status: Never Smoker  . Smokeless tobacco: Never Used  . Alcohol use No     Allergies   Bee venom and Penicillins   Review of Systems Review of Systems  All other systems reviewed and are negative.   Physical Exam Updated Vital Signs BP 129/84 (BP Location: Right Arm)   Pulse 94   Temp 99.6 F (37.6 C) (Oral)   Resp 20   Ht 6' (1.829 m)   Wt 275 lb (124.7 kg)   SpO2 96%   BMI 37.30 kg/m   Physical Exam General: Well-developed, well-nourished male in no acute distress; appearance consistent with age of record HENT: normocephalic; atraumatic Eyes: pupils equal, round and reactive to light; extraocular muscles intact Neck: supple Heart: regular rate and rhythm Lungs: clear to auscultation bilaterally Abdomen: soft; nondistended; nontender; no masses or hepatosplenomegaly; bowel sounds present Extremities: No deformity; full range of motion;  pulses normal Neurologic: Awake, alert and oriented; motor function intact in all extremities and symmetric; no facial droop Skin: Warm and dry Psychiatric: Normal mood and affect    ED Treatments / Results   Nursing notes and vitals signs, including pulse oximetry, reviewed.  Summary of this visit's results, reviewed by myself:  Labs:  Results for orders placed or performed during the hospital encounter of 10/02/15 (from the past 24 hour(s))  CBG monitoring, ED     Status: Abnormal   Collection Time: 10/02/15  4:36 AM  Result Value Ref Range   Glucose-Capillary 349 (H) 65 - 99 mg/dL  CBG monitoring, ED     Status: Abnormal    Collection Time: 10/02/15  5:51 AM  Result Value Ref Range   Glucose-Capillary 324 (H) 65 - 99 mg/dL   1:615:57 AM Patient feeling better after IV fluids and medications. He admits to being noncompliant with his metformin due to side effects and is out of his Lantus. He has ordered a refill of his Lantus. He was advised the importance of being compliant as improperly treated diabetes can lead to long-term health problems.  Procedures (including critical care time)   Final Clinical Impressions(s) / ED Diagnoses   Final diagnoses:  Viral illness  Hyperglycemia      Paula LibraJohn Tramaine Snell, MD 10/02/15 [phone]    Paula LibraJohn Yanil Dawe, MD 10/02/15 [id number]    Paula LibraJohn Kore Madlock, MD 10/02/15 [phone]

## 2015-10-03 ENCOUNTER — Emergency Department (HOSPITAL_COMMUNITY): Payer: Managed Care, Other (non HMO)

## 2015-10-03 ENCOUNTER — Observation Stay (HOSPITAL_COMMUNITY)
Admission: EM | Admit: 2015-10-03 | Discharge: 2015-10-04 | Disposition: A | Discharge status: Home / Self Care | Payer: Managed Care, Other (non HMO) | Attending: Internal Medicine | Admitting: Internal Medicine

## 2015-10-03 ENCOUNTER — Encounter (HOSPITAL_COMMUNITY): Payer: Self-pay | Admitting: Emergency Medicine

## 2015-10-03 DIAGNOSIS — E869 Volume depletion, unspecified: Secondary | ICD-10-CM | POA: Diagnosis not present

## 2015-10-03 DIAGNOSIS — R112 Nausea with vomiting, unspecified: Secondary | ICD-10-CM | POA: Diagnosis not present

## 2015-10-03 DIAGNOSIS — E0821 Diabetes mellitus due to underlying condition with diabetic nephropathy: Secondary | ICD-10-CM

## 2015-10-03 DIAGNOSIS — R739 Hyperglycemia, unspecified: Secondary | ICD-10-CM

## 2015-10-03 DIAGNOSIS — Z9119 Patient's noncompliance with other medical treatment and regimen: Secondary | ICD-10-CM

## 2015-10-03 DIAGNOSIS — G43009 Migraine without aura, not intractable, without status migrainosus: Secondary | ICD-10-CM

## 2015-10-03 DIAGNOSIS — Z794 Long term (current) use of insulin: Secondary | ICD-10-CM

## 2015-10-03 DIAGNOSIS — R1110 Vomiting, unspecified: Secondary | ICD-10-CM

## 2015-10-03 HISTORY — DX: Patient's noncompliance with other medical treatment and regimen: Z91.19

## 2015-10-03 HISTORY — DX: Patient's noncompliance with other medical treatment and regimen due to unspecified reason: Z91.199

## 2015-10-03 LAB — CBC WITH DIFFERENTIAL/PLATELET
BASOS ABS: 0 10*3/uL (ref 0.0–0.1)
BASOS PCT: 0 %
EOS ABS: 0 10*3/uL (ref 0.0–0.7)
Eosinophils Relative: 0 %
HEMATOCRIT: 43.3 % (ref 39.0–52.0)
HEMOGLOBIN: 15.1 g/dL (ref 13.0–17.0)
Lymphocytes Relative: 17 %
Lymphs Abs: 1.5 10*3/uL (ref 0.7–4.0)
MCH: 26.2 pg (ref 26.0–34.0)
MCHC: 34.9 g/dL (ref 30.0–36.0)
MCV: 75 fL — ABNORMAL LOW (ref 78.0–100.0)
MONOS PCT: 11 %
Monocytes Absolute: 1 10*3/uL (ref 0.1–1.0)
NEUTROS ABS: 6.5 10*3/uL (ref 1.7–7.7)
NEUTROS PCT: 72 %
Platelets: 224 10*3/uL (ref 150–400)
RBC: 5.77 MIL/uL (ref 4.22–5.81)
RDW: 13.6 % (ref 11.5–15.5)
WBC: 9 10*3/uL (ref 4.0–10.5)

## 2015-10-03 LAB — COMPREHENSIVE METABOLIC PANEL
ALBUMIN: 3.9 g/dL (ref 3.5–5.0)
ALT: 21 U/L (ref 17–63)
ANION GAP: 8 (ref 5–15)
AST: 20 U/L (ref 15–41)
Alkaline Phosphatase: 61 U/L (ref 38–126)
BILIRUBIN TOTAL: 1.4 mg/dL — AB (ref 0.3–1.2)
BUN: 16 mg/dL (ref 6–20)
CO2: 26 mmol/L (ref 22–32)
Calcium: 9.1 mg/dL (ref 8.9–10.3)
Chloride: 104 mmol/L (ref 101–111)
Creatinine, Ser: 1.29 mg/dL — ABNORMAL HIGH (ref 0.61–1.24)
GFR calc non Af Amer: >60 mL/min (ref >60)
GLUCOSE: 247 mg/dL — AB (ref 65–99)
POTASSIUM: 4.3 mmol/L (ref 3.5–5.1)
SODIUM: 138 mmol/L (ref 135–145)
TOTAL PROTEIN: 7.6 g/dL (ref 6.5–8.1)

## 2015-10-03 LAB — LIPASE, BLOOD: Lipase: 45 U/L (ref 11–51)

## 2015-10-03 LAB — CBG MONITORING, ED: GLUCOSE-CAPILLARY: 303 mg/dL — AB (ref 65–99)

## 2015-10-03 LAB — MAGNESIUM: Magnesium: 1.9 mg/dL (ref 1.7–2.4)

## 2015-10-03 LAB — PHOSPHORUS: Phosphorus: 2.4 mg/dL — ABNORMAL LOW (ref 2.5–4.6)

## 2015-10-03 LAB — MRSA PCR SCREENING: MRSA by PCR: NEGATIVE

## 2015-10-03 LAB — GLUCOSE, CAPILLARY: Glucose-Capillary: 232 mg/dL — ABNORMAL HIGH (ref 65–99)

## 2015-10-03 MED ORDER — INSULIN ASPART 100 UNIT/ML ~~LOC~~ SOLN
0.0000 [IU] | Freq: Three times a day (TID) | SUBCUTANEOUS | Status: DC
  Administered 2015-10-03: 3 [IU] via SUBCUTANEOUS
  Administered 2015-10-04 (×2): 2 [IU] via SUBCUTANEOUS

## 2015-10-03 MED ORDER — METOCLOPRAMIDE HCL 5 MG/ML IJ SOLN
5.0000 mg | Freq: Four times a day (QID) | INTRAMUSCULAR | Status: DC
  Administered 2015-10-03 – 2015-10-04 (×3): 5 mg via INTRAVENOUS
  Filled 2015-10-03 (×3): qty 2

## 2015-10-03 MED ORDER — SODIUM CHLORIDE 0.9 % IV BOLUS (SEPSIS): 1000.0000 mL | Freq: Once | INTRAVENOUS | Status: DC

## 2015-10-03 MED ORDER — METOCLOPRAMIDE HCL 5 MG/ML IJ SOLN
10.0000 mg | Freq: Once | INTRAMUSCULAR | Status: AC
  Administered 2015-10-03: 10 mg via INTRAVENOUS
  Filled 2015-10-03: qty 2

## 2015-10-03 MED ORDER — METFORMIN HCL 500 MG PO TABS
1000.0000 mg | ORAL_TABLET | Freq: Two times a day (BID) | ORAL | Status: DC
  Administered 2015-10-04: 1000 mg via ORAL
  Filled 2015-10-03: qty 2

## 2015-10-03 MED ORDER — SODIUM CHLORIDE 0.9 % IV BOLUS (SEPSIS)
1000.0000 mL | Freq: Once | INTRAVENOUS | Status: AC
  Administered 2015-10-03: 1000 mL via INTRAVENOUS

## 2015-10-03 MED ORDER — SUMATRIPTAN SUCCINATE 50 MG PO TABS
50.0000 mg | ORAL_TABLET | ORAL | Status: DC | PRN
  Administered 2015-10-03 – 2015-10-04 (×2): 50 mg via ORAL
  Filled 2015-10-03 (×3): qty 1

## 2015-10-03 MED ORDER — ENOXAPARIN SODIUM 60 MG/0.6ML ~~LOC~~ SOLN
60.0000 mg | SUBCUTANEOUS | Status: DC
  Administered 2015-10-03: 60 mg via SUBCUTANEOUS
  Filled 2015-10-03: qty 0.6

## 2015-10-03 MED ORDER — INSULIN ASPART 100 UNIT/ML ~~LOC~~ SOLN
0.0000 [IU] | Freq: Every day | SUBCUTANEOUS | Status: DC
  Administered 2015-10-03: 3 [IU] via SUBCUTANEOUS

## 2015-10-03 MED ORDER — FAMOTIDINE IN NACL 20-0.9 MG/50ML-% IV SOLN
20.0000 mg | Freq: Once | INTRAVENOUS | Status: AC
  Administered 2015-10-03: 20 mg via INTRAVENOUS
  Filled 2015-10-03: qty 50

## 2015-10-03 MED ORDER — ONDANSETRON HCL 4 MG/2ML IJ SOLN: 4.0000 mg | INTRAMUSCULAR | Status: DC | PRN

## 2015-10-03 MED ORDER — SODIUM CHLORIDE 0.9 % IV SOLN
INTRAVENOUS | Status: DC
  Administered 2015-10-03 – 2015-10-04 (×2): via INTRAVENOUS

## 2015-10-03 MED ORDER — PRAVASTATIN SODIUM 20 MG PO TABS
20.0000 mg | ORAL_TABLET | Freq: Every day | ORAL | Status: DC
  Administered 2015-10-03 – 2015-10-04 (×2): 20 mg via ORAL
  Filled 2015-10-03 (×2): qty 1

## 2015-10-03 MED ORDER — INSULIN GLARGINE 100 UNIT/ML ~~LOC~~ SOLN
20.0000 [IU] | Freq: Every day | SUBCUTANEOUS | Status: DC
  Administered 2015-10-03: 20 [IU] via SUBCUTANEOUS
  Filled 2015-10-03 (×2): qty 0.2

## 2015-10-03 MED ORDER — PROMETHAZINE HCL 25 MG RE SUPP: 25.0000 mg | Freq: Four times a day (QID) | RECTAL | 0 refills | Status: DC | PRN

## 2015-10-03 MED ORDER — ACETAMINOPHEN 325 MG PO TABS
650.0000 mg | ORAL_TABLET | Freq: Once | ORAL | Status: AC
  Administered 2015-10-03: 650 mg via ORAL
  Filled 2015-10-03: qty 2

## 2015-10-03 NOTE — H&P (Signed)
History and Physical  Timothy Heath YNW:[id number] DOB: 01/08/1976 DOA: 10/03/2015  Referring physician: ER Physician PCP: Aura DialsBOUSKA,DAVID E, MD  Outpatient Specialists:    Patient coming from: Home  Chief Complaint: Nausea and vomiting/Headache.  HPI: 40 year old male with history of DM that is poorly controlled, non compliance, DM and Hypertension. Patient presents with headache that started 2 days ago. Headache is mainly frontal, throbbing with associated light and noise avoidance. However, patient denies history of migraine. Patient developed nausea and vomiting yesterday, and nausea and vomiting have been persistent. Patient has had recurrent nausea and vomiting but no documented history of diabetic gastroparesis. Fever is reported. No neck pain, no chest pain, no SOB and no abdominal pain. No urinary symptoms or joint ache. Work up done in ER is non revealing. Patient has CKD 2 that is stable.  ED Course: Hydrated. CT Scan ordered.  Pertinent labs: No new significant findings.  Review of Systems:  As in HPI. Negative for visual changes, sore throat, rash, new muscle aches, chest pain, SOB, dysuria, bleeding, n/v/abdominal pain.  Past Medical History:  Diagnosis Date  . Allergy   . Asthma   . Diabetes mellitus   . Hypertension   . Noncompliance     Past Surgical History:  Procedure Laterality Date  . APPENDECTOMY    . FRACTURE SURGERY    . HERNIA REPAIR    . KNEE SURGERY       reports that he has never smoked. He has never used smokeless tobacco. He reports that he does not drink alcohol or use drugs.  Allergies  Allergen Reactions  . Bee Venom Anaphylaxis  . Penicillins Anaphylaxis and Shortness Of Breath    Has patient had a PCN reaction causing immediate rash, facial/tongue/throat swelling, SOB or lightheadedness with hypotension:YES Has patient had a PCN reaction causing severe rash involving mucus membranes or skin necrosis:  NO Has patient had a PCN reaction  that required hospitalization YES Has patient had a PCN reaction occurring within the last 10 years: NO If all of the above answers are "NO", then may proceed with Cephalosporin use.     Family History  Problem Relation Age of Onset  . Diabetes Mother   . Hypertension Mother   . Kidney disease Father   . Hypertension Father   . Diabetes Maternal Grandmother   . Hypertension Maternal Grandmother   . Stroke Maternal Grandmother   . Kidney disease Maternal Grandmother   . Depression Brother      Prior to Admission medications   Medication Sig Start Date End Date Taking? Authorizing Provider  acetaminophen (TYLENOL) 500 MG tablet Take 1,000 mg by mouth every 6 (six) hours as needed for mild pain or headache. For pain.   Yes Historical Provider, MD  insulin glargine (LANTUS) 100 UNIT/ML injection Inject 20 Units into the skin at bedtime.   Yes Historical Provider, MD  metFORMIN (GLUCOPHAGE) 1000 MG tablet Take 1 tablet (1,000 mg total) by mouth 2 (two) times daily with a meal. 12/16/13  Yes Earley FavorGail Schulz, NP  metFORMIN (GLUCOPHAGE) 1000 MG tablet Take 1,000 mg by mouth 2 (two) times daily. 06/27/15  Yes Historical Provider, MD  ondansetron (ZOFRAN ODT) 8 MG disintegrating tablet Take 1 tablet (8 mg total) by mouth every 8 (eight) hours as needed for nausea or vomiting. 10/02/15  Yes Paula LibraJohn Molpus, MD  pravastatin (PRAVACHOL) 20 MG tablet Take 20 mg by mouth daily. 05/19/15 05/18/16 Yes Historical Provider, MD  promethazine (PHENERGAN) 25 MG  suppository Place 1 suppository (25 mg total) rectally every 6 (six) hours as needed for nausea or vomiting. 10/03/15  Yes Barrett Henle, [place]    Physical Exam: Vitals:   10/03/15 1528  BP: 172/98  Pulse: 84  Resp: 16  Temp: 99.1 F (37.3 C)  TempSrc: Oral  SpO2: 98%    Constitutional:  . Appears calm and comfortable. Obese. Room is kept dark. Eyes:  . No pallor. No jaundice.  ENMT:  . external ears, nose appear normal Neck:  . Neck  is supple. No JVD Respiratory:  . CTA bilaterally, no w/r/r.  . Respiratory effort normal. No retractions or accessory muscle use Cardiovascular:  . S1S2 . No LE extremity edema   Abdomen:  . Abdomen is soft and non tender. Organs are difficult to assess. Neurologic:  . Awake and alert. . Moves all limbs.  Wt Readings from Last 3 Encounters:  10/02/15 124.7 kg (275 lb)  10/02/15 124.7 kg (275 lb)  06/23/15 122.5 kg (270 lb)    I have personally reviewed following labs and imaging studies  Labs on Admission:  CBC:  Recent Labs Lab 10/02/15 2310 10/03/15 1550  WBC 9.9 9.0  NEUTROABS 8.3* 6.5  HGB 14.4 15.1  HCT 41.7 43.3  MCV 74.6* 75.0*  PLT 212 224   Basic Metabolic Panel:  Recent Labs Lab 10/02/15 2310 10/03/15 1550  NA 136 138  K 4.5 4.3  CL 101 104  CO2 23 26  GLUCOSE 312* 247*  BUN 14 16  CREATININE 1.39* 1.29*  CALCIUM 8.9 9.1   Liver Function Tests:  Recent Labs Lab 10/02/15 2310 10/03/15 1550  AST 22 20  ALT 23 21  ALKPHOS 59 61  BILITOT 0.9 1.4*  PROT 7.0 7.6  ALBUMIN 3.6 3.9    Recent Labs Lab 10/02/15 2310 10/03/15 1550  LIPASE 39 45   No results for input(s): AMMONIA in the last 168 hours. Coagulation Profile: No results for input(s): INR, PROTIME in the last 168 hours. Cardiac Enzymes: No results for input(s): CKTOTAL, CKMB, CKMBINDEX, TROPONINI in the last 168 hours. BNP (last 3 results) No results for input(s): PROBNP in the last 8760 hours. HbA1C: No results for input(s): HGBA1C in the last 72 hours. CBG:  Recent Labs Lab 10/02/15 0436 10/02/15 0551 10/02/15 2307 10/03/15 0035  GLUCAP 349* 324* 312* 303*   Lipid Profile: No results for input(s): CHOL, HDL, LDLCALC, TRIG, CHOLHDL, LDLDIRECT in the last 72 hours. Thyroid Function Tests: No results for input(s): TSH, T4TOTAL, FREET4, T3FREE, THYROIDAB in the last 72 hours. Anemia Panel: No results for input(s): VITAMINB12, FOLATE, FERRITIN, TIBC, IRON,  RETICCTPCT in the last 72 hours. Urine analysis:    Component Value Date/Time   COLORURINE YELLOW 09/20/2011 1938   APPEARANCEUR CLEAR 09/20/2011 1938   LABSPEC 1.035 (H) 09/20/2011 1938   PHURINE 5.5 09/20/2011 1938   GLUCOSEU >1000 (A) 09/20/2011 1938   HGBUR NEGATIVE 09/20/2011 1938   BILIRUBINUR NEGATIVE 09/20/2011 1938   KETONESUR NEGATIVE 09/20/2011 1938   PROTEINUR NEGATIVE 09/20/2011 1938   UROBILINOGEN 0.2 09/20/2011 1938   NITRITE NEGATIVE 09/20/2011 1938   LEUKOCYTESUR TRACE (A) 09/20/2011 1938   Sepsis Labs: @LABRCNTIP (procalcitonin:4,lacticidven:4) )No results found for this or any previous visit (from the past 240 hour(s)).    Radiological Exams on Admission: Ct Head Wo Contrast  Result Date: 10/03/2015 CLINICAL DATA:  Headache beginning Wednesday.  Nausea, vomiting. EXAM: CT HEAD WITHOUT CONTRAST TECHNIQUE: Contiguous axial images were obtained from the base of the  skull through the vertex without intravenous contrast. COMPARISON:  None. FINDINGS: Brain: No acute intracranial abnormality. Specifically, no hemorrhage, hydrocephalus, mass lesion, acute infarction, or significant intracranial injury. Vascular: No hyperdense vessel or unexpected calcification. Skull: No acute calvarial abnormality. Sinuses/Orbits: Visualized paranasal sinuses and mastoids clear. Orbital soft tissues unremarkable. Other: None IMPRESSION: Negative. Electronically Signed   By: Charlett Nose M.D.   On: 10/03/2015 17:01   Dg Abd Acute W/chest  Result Date: 10/03/2015 CLINICAL DATA:  N/V x 3 days; hx appendectomy in 2000; no known cardiopulmonary problems; diabetic; non smoker EXAM: DG ABDOMEN ACUTE W/ 1V CHEST COMPARISON:  None. FINDINGS: Normal mediastinum and cardiac silhouette. Normal pulmonary vasculature. No evidence of effusion, infiltrate, or pneumothorax. No acute bony abnormality. No dilated loops of large or small bowel. Gas and stool in the rectum. No pathologic calcifications. No  organomegaly. No acute osseous abnormality IMPRESSION: 1.  No acute cardiopulmonary process. 2. No bowel obstruction or free air. Electronically Signed   By: Genevive Bi M.D.   On: 10/03/2015 16:40   Active Problems:   Nausea and vomiting   Assessment/Plan 1. Nausea and vomiting 2. Headache, worrisome for migraine headache 3. Volume depletion 4. DM, uncontrolled 5. CKD 2 6. Hypertension 7. Non compliance   Observation in Hospital  Imitrex PRN  IV Reglan  IVF  Monitor renal function and electrolytes  Optimize blood sugar and BP control  Check HbAic  Further management will depend on hospital course  DVT prophylaxis: Snead Lovenox Code Status: Full Family Communication: Wife Disposition Plan: Home eventually   Consults called: None   Admission status: Observation    Time spent: Greater than 60 minutes  Berton Mount, MD  [facility] Pager #: [phone] 7PM-7AM contact night coverage as above   10/03/2015, 5:29 PM

## 2015-10-03 NOTE — ED Notes (Signed)
Per PCP-states unable to keep fluids-states sending to ED for fluids, abdominal work up

## 2015-10-03 NOTE — ED Triage Notes (Signed)
Pt reports he has had a HA and intractable emesis for the past 3 days.

## 2015-10-03 NOTE — ED Provider Notes (Signed)
[facility]-EMERGENCY DEPT Provider Note   CSN: [id number] Arrival date & time: 10/03/15  1515     History   Chief Complaint Chief Complaint  Patient presents with  . Headache  . Emesis    HPI Timothy Heath is a 40 y.o. male.  HPI  Pt was seen at 1530. Per pt, c/o gradual onset and persistence of multiple intermittent episodes of N/V for the past 3 days. Has been associated with frontal headache. Pt states he took ODT zofran and "threw it up," and "the suppository didn't work" for his nausea.  Pt has not taken his insulin today (he had run out several days ago but has since refilled his rx), but has taken his metformin.  Denies diarrhea, no abd pain, no CP/SOB, no neck or back pain, no sore throat, no rash, no fevers, no black or blood in stools or emesis. Denies headache was sudden or maximal at onset or at any time. The symptoms have been associated with no other complaints. The patient has a significant history of similar symptoms previously, recently being evaluated for this complaint and multiple prior evals for same: pt was evaluated in the ED x2 yesterday, as well as by his PMD PTA. Pt states his PMD "gave me a shot," then sent him to the ED today "to get admitted for more IV fluids."     Past Medical History:  Diagnosis Date  . Allergy   . Asthma   . Diabetes mellitus   . Hypertension   . Noncompliance     Patient Active Problem List   Diagnosis Date Noted  . DM 10/31/2006  . HYPERTENSION 10/31/2006  . ASTHMA 10/31/2006    Past Surgical History:  Procedure Laterality Date  . APPENDECTOMY    . FRACTURE SURGERY    . HERNIA REPAIR    . KNEE SURGERY         Home Medications    Prior to Admission medications   Medication Sig Start Date End Date Taking? Authorizing Provider  acetaminophen (TYLENOL) 500 MG tablet Take 1,000 mg by mouth every 6 (six) hours as needed for mild pain or headache. For pain.   Yes Historical Provider, MD  insulin glargine (LANTUS) 100  UNIT/ML injection Inject 20 Units into the skin at bedtime.   Yes Historical Provider, MD  metFORMIN (GLUCOPHAGE) 1000 MG tablet Take 1 tablet (1,000 mg total) by mouth 2 (two) times daily with a meal. 12/16/13  Yes Earley Favor, NP  metFORMIN (GLUCOPHAGE) 1000 MG tablet Take 1,000 mg by mouth 2 (two) times daily. 06/27/15  Yes Historical Provider, MD  ondansetron (ZOFRAN ODT) 8 MG disintegrating tablet Take 1 tablet (8 mg total) by mouth every 8 (eight) hours as needed for nausea or vomiting. 10/02/15  Yes Paula Libra, MD  pravastatin (PRAVACHOL) 20 MG tablet Take 20 mg by mouth daily. 05/19/15 05/18/16 Yes Historical Provider, MD  promethazine (PHENERGAN) 25 MG suppository Place 1 suppository (25 mg total) rectally every 6 (six) hours as needed for nausea or vomiting. 10/03/15  Yes Barrett Henle, PA-C  omeprazole (PRILOSEC) 20 MG capsule Take 1 capsule (20 mg total) by mouth daily. Patient not taking: Reported on 10/03/2015 06/23/15   Azalia Bilis, MD    Family History Family History  Problem Relation Age of Onset  . Diabetes Mother   . Hypertension Mother   . Kidney disease Father   . Hypertension Father   . Diabetes Maternal Grandmother   . Hypertension Maternal Grandmother   .  Stroke Maternal Grandmother   . Kidney disease Maternal Grandmother   . Depression Brother     Social History Social History  Substance Use Topics  . Smoking status: Never Smoker  . Smokeless tobacco: Never Used  . Alcohol use No     Allergies   Bee venom and Penicillins   Review of Systems Review of Systems ROS: Statement: All systems negative except as marked or noted in the HPI; Constitutional: Negative for fever and chills. ; ; Eyes: Negative for eye pain, redness and discharge. ; ; ENMT: Negative for ear pain, hoarseness, nasal congestion, sinus pressure and sore throat. ; ; Cardiovascular: Negative for chest pain, palpitations, diaphoresis, dyspnea and peripheral edema. ; ; Respiratory:  Negative for cough, wheezing and stridor. ; ; Gastrointestinal: +N/V, poor PO intake. Negative for diarrhea, abdominal pain, blood in stool, hematemesis, jaundice and rectal bleeding. . ; ; Genitourinary: Negative for dysuria, flank pain and hematuria. ; ; Musculoskeletal: Negative for back pain and neck pain. Negative for swelling and trauma.; ; Skin: Negative for pruritus, rash, abrasions, blisters, bruising and skin lesion.; ; Neuro: +frontal headache. Negative for lightheadedness and neck stiffness. Negative for weakness, altered level of consciousness, altered mental status, extremity weakness, paresthesias, involuntary movement, seizure and syncope.       Physical Exam Updated Vital Signs BP 172/98   Pulse 84   Temp 99.1 F (37.3 C) (Oral)   Resp 16   SpO2 98%   Physical Exam 1535: Physical examination:  Nursing notes reviewed; Vital signs and O2 SAT reviewed;  Constitutional: Well developed, Well nourished, In no acute distress; Head:  Normocephalic, atraumatic; Eyes: EOMI, PERRL, No scleral icterus; ENMT: TM's clear bilat. Mouth and pharynx normal, Mucous membranes dry; Neck: Supple, Full range of motion, No lymphadenopathy; Cardiovascular: Regular rate and rhythm, No gallop; Respiratory: Breath sounds clear & equal bilaterally, No wheezes.  Speaking full sentences with ease, Normal respiratory effort/excursion; Chest: Nontender, Movement normal; Abdomen: Soft, Nontender, Nondistended, Normal bowel sounds; Genitourinary: No CVA tenderness; Extremities: Pulses normal, No tenderness, No edema, No calf edema or asymmetry.; Neuro: AA&Ox3, Major CN grossly intact.  Speech clear. No gross focal motor or sensory deficits in extremities.; Skin: Color normal, Warm, Dry.   ED Treatments / Results  Labs (all labs ordered are listed, but only abnormal results are displayed)   EKG  EKG Interpretation None       Radiology   Procedures Procedures (including critical care  time)  Medications Ordered in ED Medications  sodium chloride 0.9 % bolus 1,000 mL (not administered)  0.9 %  sodium chloride infusion (not administered)  metoCLOPramide (REGLAN) injection 10 mg (not administered)  famotidine (PEPCID) IVPB 20 mg premix (not administered)     Initial Impression / Assessment and Plan / ED Course  I have reviewed the triage vital signs and the nursing notes.  Pertinent labs & imaging results that were available during my care of the patient were reviewed by me and considered in my medical decision making (see chart for details).  MDM Reviewed: nursing note, vitals and previous chart Reviewed previous: labs Interpretation: labs, x-ray and CT scan   Results for orders placed or performed during the hospital encounter of 10/03/15  Comprehensive metabolic panel  Result Value Ref Range   Sodium 138 135 - 145 mmol/L   Potassium 4.3 3.5 - 5.1 mmol/L   Chloride 104 101 - 111 mmol/L   CO2 26 22 - 32 mmol/L   Glucose, Bld 247 (H) 65 -  99 mg/dL   BUN 16 6 - 20 mg/dL   Creatinine, Ser 1.19 (H) 0.61 - 1.24 mg/dL   Calcium 9.1 8.9 - 14.7 mg/dL   Total Protein 7.6 6.5 - 8.1 g/dL   Albumin 3.9 3.5 - 5.0 g/dL   AST 20 15 - 41 U/L   ALT 21 17 - 63 U/L   Alkaline Phosphatase 61 38 - 126 U/L   Total Bilirubin 1.4 (H) 0.3 - 1.2 mg/dL   GFR calc non Af Amer >60 >60 mL/min   GFR calc Af Amer >60 >60 mL/min   Anion gap 8 5 - 15  Lipase, blood  Result Value Ref Range   Lipase 45 11 - 51 U/L  CBC with Differential  Result Value Ref Range   WBC 9.0 4.0 - 10.5 K/uL   RBC 5.77 4.22 - 5.81 MIL/uL   Hemoglobin 15.1 13.0 - 17.0 g/dL   HCT 82.9 56.2 - 13.0 %   MCV 75.0 (L) 78.0 - 100.0 fL   MCH 26.2 26.0 - 34.0 pg   MCHC 34.9 30.0 - 36.0 g/dL   RDW 86.5 78.4 - 69.6 %   Platelets 224 150 - 400 K/uL   Neutrophils Relative % 72 %   Neutro Abs 6.5 1.7 - 7.7 K/uL   Lymphocytes Relative 17 %   Lymphs Abs 1.5 0.7 - 4.0 K/uL   Monocytes Relative 11 %   Monocytes  Absolute 1.0 0.1 - 1.0 K/uL   Eosinophils Relative 0 %   Eosinophils Absolute 0.0 0.0 - 0.7 K/uL   Basophils Relative 0 %   Basophils Absolute 0.0 0.0 - 0.1 K/uL   Ct Head Wo Contrast Result Date: 10/03/2015 CLINICAL DATA:  Headache beginning Wednesday.  Nausea, vomiting. EXAM: CT HEAD WITHOUT CONTRAST TECHNIQUE: Contiguous axial images were obtained from the base of the skull through the vertex without intravenous contrast. COMPARISON:  None. FINDINGS: Brain: No acute intracranial abnormality. Specifically, no hemorrhage, hydrocephalus, mass lesion, acute infarction, or significant intracranial injury. Vascular: No hyperdense vessel or unexpected calcification. Skull: No acute calvarial abnormality. Sinuses/Orbits: Visualized paranasal sinuses and mastoids clear. Orbital soft tissues unremarkable. Other: None IMPRESSION: Negative. Electronically Signed   By: Charlett Nose M.D.   On: 10/03/2015 17:01   Dg Abd Acute W/chest Result Date: 10/03/2015 CLINICAL DATA:  N/V x 3 days; hx appendectomy in 2000; no known cardiopulmonary problems; diabetic; non smoker EXAM: DG ABDOMEN ACUTE W/ 1V CHEST COMPARISON:  None. FINDINGS: Normal mediastinum and cardiac silhouette. Normal pulmonary vasculature. No evidence of effusion, infiltrate, or pneumothorax. No acute bony abnormality. No dilated loops of large or small bowel. Gas and stool in the rectum. No pathologic calcifications. No organomegaly. No acute osseous abnormality IMPRESSION: 1.  No acute cardiopulmonary process. 2. No bowel obstruction or free air. Electronically Signed   By: Genevive Bi M.D.   On: 10/03/2015 16:40   Results for DAEVEON, ZWEBER (MRN [id number]) as of 10/03/2015 17:06  Ref. Range 09/20/2011 15:30 02/27/2012 14:12 12/16/2013 02:05 10/02/2015 23:10 10/03/2015 15:50  BUN Latest Ref Range: 6 - 20 mg/dL 10 14 14 14 16   Creatinine Latest Ref Range: 0.61 - 1.24 mg/dL 4.40 1.02 7.25 3.66 (H) 1.29 (H)    1720:  Pt continues to c/o nausea  despite IV meds; will remedicate. CBG elevated, but not acidotic with normal AG. BUN/Cr slowly elevating for the past several years. Family states pt "was sent here to get admitted."  T/C to [facility]. Cydney Ok, case discussed, including:  HPI, pertinent PM/SHx, VS/PE, dx testing, ED course and treatment:  Agreeable to admit, requests to write temporary orders, obtain observation medical bed to team [facility].      Final Clinical Impressions(s) / ED Diagnoses   Final diagnoses:  None    New Prescriptions New Prescriptions   No medications on file     Samuel JesterKathleen Valerya Maxton, DO 10/04/15 2337

## 2015-10-03 NOTE — Discharge Instructions (Signed)
Take your medications as prescribed as needed for nausea/vomiting. You may also take your prescription of zofran as needed. I recommend continuing to drink fluids to stay hydrated at home and eating a bland diet for the next few days.  Follow up with your primary care provider in 3-4 days for follow. Continue monitoring your glucose at home and taking your metformin and insulin as prescribed. Please return to the Emergency Department if symptoms worsen or new onset of fever, abdominal pain, neck stiffness, unable to keep fluids down, blood in vomit or stool, chest pain, difficulty breathing, numbness, tingling, weakness.

## 2015-10-04 DIAGNOSIS — G43009 Migraine without aura, not intractable, without status migrainosus: Secondary | ICD-10-CM | POA: Diagnosis not present

## 2015-10-04 DIAGNOSIS — E0821 Diabetes mellitus due to underlying condition with diabetic nephropathy: Secondary | ICD-10-CM | POA: Diagnosis not present

## 2015-10-04 DIAGNOSIS — E869 Volume depletion, unspecified: Secondary | ICD-10-CM | POA: Diagnosis not present

## 2015-10-04 DIAGNOSIS — R112 Nausea with vomiting, unspecified: Secondary | ICD-10-CM | POA: Diagnosis not present

## 2015-10-04 LAB — BASIC METABOLIC PANEL
Anion gap: 7 (ref 5–15)
BUN: 13 mg/dL (ref 6–20)
CO2: 26 mmol/L (ref 22–32)
Calcium: 8.5 mg/dL — ABNORMAL LOW (ref 8.9–10.3)
Chloride: 106 mmol/L (ref 101–111)
Creatinine, Ser: 1.21 mg/dL (ref 0.61–1.24)
GFR calc Af Amer: >60 mL/min (ref >60)
GFR calc non Af Amer: >60 mL/min (ref >60)
Glucose, Bld: 182 mg/dL — ABNORMAL HIGH (ref 65–99)
Potassium: 3.6 mmol/L (ref 3.5–5.1)
Sodium: 139 mmol/L (ref 135–145)

## 2015-10-04 LAB — CBC
HCT: 40.9 % (ref 39.0–52.0)
Hemoglobin: 13.8 g/dL (ref 13.0–17.0)
MCH: 25.7 pg — ABNORMAL LOW (ref 26.0–34.0)
MCHC: 33.7 g/dL (ref 30.0–36.0)
MCV: 76.2 fL — ABNORMAL LOW (ref 78.0–100.0)
Platelets: 209 10*3/uL (ref 150–400)
RBC: 5.37 MIL/uL (ref 4.22–5.81)
RDW: 13.6 % (ref 11.5–15.5)
WBC: 7.8 10*3/uL (ref 4.0–10.5)

## 2015-10-04 LAB — GLUCOSE, CAPILLARY
Glucose-Capillary: 177 mg/dL — ABNORMAL HIGH (ref 65–99)
Glucose-Capillary: 185 mg/dL — ABNORMAL HIGH (ref 65–99)

## 2015-10-04 MED ORDER — LIP MEDEX EX OINT
TOPICAL_OINTMENT | CUTANEOUS | Status: AC
  Administered 2015-10-04: 09:00:00
  Filled 2015-10-04: qty 7

## 2015-10-04 MED ORDER — METOCLOPRAMIDE HCL 5 MG PO TABS: 5.0000 mg | ORAL_TABLET | Freq: Four times a day (QID) | ORAL | 1 refills | Status: DC | PRN

## 2015-10-04 MED ORDER — SUMATRIPTAN SUCCINATE 50 MG PO TABS: 50.0000 mg | ORAL_TABLET | ORAL | 0 refills | Status: DC | PRN

## 2015-10-04 MED ORDER — LISINOPRIL 10 MG PO TABS: 10.0000 mg | ORAL_TABLET | Freq: Every day | ORAL | 1 refills | Status: DC

## 2015-10-04 NOTE — Discharge Summary (Signed)
Physician Discharge Summary  Timothy RutherfordJoseph A Heath ZOX:[id number] DOB: Jul 19, 1975 DOA: 10/03/2015  PCP: Timothy DialsBOUSKA,DAVID E, MD  Admit date: 10/03/2015 Discharge date: 10/04/2015  Time spent: Greater than 30 minutes  Recommendations for Outpatient Follow-up:  1. Follow up with PCP in one week 2. Monitor BP closely at home.   Discharge Diagnoses:  Active Problems:   Nausea and vomiting Hypertension DM, uncontrolled Non compliance Volume depletion AKI on CKD 2 Migraine Head ache  Discharge Condition: Stable  Diet recommendation: Modified Carbohydrate diet  Filed Weights   10/03/15 1845  Weight: 124.7 kg (274 lb 14.6 oz)    History of present illness: 40 year old male with history of DM that is poorly controlled, non compliance, DM and Hypertension. Patient presented with headache that started 2 days prior to admission. Headache was mainly frontal, throbbing with associated light and noise avoidance. However, patient denied history of migraine. Patient developed nausea and vomiting a day prior to admission. Nausea and vomiting have been persistent. Patient has history of recurrent nausea and vomiting but no documented history of diabetic gastroparesis. Fever was reported. No neck pain, no chest pain, no SOB and no abdominal pain. No urinary symptoms or joint ache.    Hospital Course: Patient was admitted for further assessment and management. Patient was kept NPO. Nausea and vomiting were managed with IV reglan. Nausea and vomiting have resolved and patient will be discharged back home on PRN Reglan. There may be need for formal gastric emptying study. Headache was manage with PRN Imitrex. Headache has resolved. Patient was volume resuscitated. Renal function has also improved significantly.  Procedures:  None  Consultations:  None  Discharge Exam: Vitals:   10/03/15 2210 10/04/15 0448  BP: (!) 168/99 (!) 154/76  Pulse: 80 84  Resp: 18 18  Temp: 99.2 F (37.3 C) 99.1 F (37.3 C)     General: Not in distress. Awake and alert Cardiovascular: S1S2 Respiratory: Clear to auscultation.  Discharge Instructions   Discharge Instructions    Diet - low sodium heart healthy    Complete by:  As directed   Diet Carb Modified    Complete by:  As directed   Discharge instructions    Complete by:  As directed   Follow up with PCP within one week. Monitor BP closely   Increase activity slowly    Complete by:  As directed     Current Discharge Medication List    START taking these medications   Details  metoCLOPramide (REGLAN) 5 MG tablet Take 1 tablet (5 mg total) by mouth every 6 (six) hours as needed for nausea or vomiting. Qty: 120 tablet, Refills: 1    SUMAtriptan (IMITREX) 50 MG tablet Take 1 tablet (50 mg total) by mouth every 2 (two) hours as needed for migraine or headache (Maximum 200mg  in one day). May repeat in 2 hours if headache persists or recurs. Qty: 20 tablet, Refills: 0      CONTINUE these medications which have NOT CHANGED   Details  insulin glargine (LANTUS) 100 UNIT/ML injection Inject 20 Units into the skin at bedtime.    !! metFORMIN (GLUCOPHAGE) 1000 MG tablet Take 1 tablet (1,000 mg total) by mouth 2 (two) times daily with a meal. Qty: 180 tablet, Refills: 3   Associated Diagnoses: Diabetes type 2, uncontrolled (HCC)    !! metFORMIN (GLUCOPHAGE) 1000 MG tablet Take 1,000 mg by mouth 2 (two) times daily.    pravastatin (PRAVACHOL) 20 MG tablet Take 20 mg by mouth daily.     !! -  Potential duplicate medications found. Please discuss with provider.    STOP taking these medications     acetaminophen (TYLENOL) 500 MG tablet      ondansetron (ZOFRAN ODT) 8 MG disintegrating tablet      promethazine (PHENERGAN) 25 MG suppository        Allergies  Allergen Reactions  . Bee Venom Anaphylaxis  . Penicillins Anaphylaxis and Shortness Of Breath    Has patient had a PCN reaction causing immediate rash, facial/tongue/throat swelling, SOB or  lightheadedness with hypotension:YES Has patient had a PCN reaction causing severe rash involving mucus membranes or skin necrosis:  NO Has patient had a PCN reaction that required hospitalization YES Has patient had a PCN reaction occurring within the last 10 years: NO If all of the above answers are "NO", then may proceed with Cephalosporin use.    Follow-up Information    BOUSKA,DAVID E, MD Follow up in 1 week(s).   Specialty:  Family Medicine Contact information: [address] [facility] [place] [address] [phone]            The results of significant diagnostics from this hospitalization (including imaging, microbiology, ancillary and laboratory) are listed below for reference.    Significant Diagnostic Studies: Ct Head Wo Contrast  Result Date: 10/03/2015 CLINICAL DATA:  Headache beginning Wednesday.  Nausea, vomiting. EXAM: CT HEAD WITHOUT CONTRAST TECHNIQUE: Contiguous axial images were obtained from the base of the skull through the vertex without intravenous contrast. COMPARISON:  None. FINDINGS: Brain: No acute intracranial abnormality. Specifically, no hemorrhage, hydrocephalus, mass lesion, acute infarction, or significant intracranial injury. Vascular: No hyperdense vessel or unexpected calcification. Skull: No acute calvarial abnormality. Sinuses/Orbits: Visualized paranasal sinuses and mastoids clear. Orbital soft tissues unremarkable. Other: None IMPRESSION: Negative. Electronically Signed   By: Charlett Nose M.D.   On: 10/03/2015 17:01   Dg Abd Acute W/chest  Result Date: 10/03/2015 CLINICAL DATA:  N/V x 3 days; hx appendectomy in 2000; no known cardiopulmonary problems; diabetic; non smoker EXAM: DG ABDOMEN ACUTE W/ 1V CHEST COMPARISON:  None. FINDINGS: Normal mediastinum and cardiac silhouette. Normal pulmonary vasculature. No evidence of effusion, infiltrate, or pneumothorax. No acute bony abnormality. No dilated loops of large or  small bowel. Gas and stool in the rectum. No pathologic calcifications. No organomegaly. No acute osseous abnormality IMPRESSION: 1.  No acute cardiopulmonary process. 2. No bowel obstruction or free air. Electronically Signed   By: Genevive Bi M.D.   On: 10/03/2015 16:40    Microbiology: Recent Results (from the past 240 hour(s))  MRSA PCR Screening     Status: None   Collection Time: 10/03/15  7:06 PM  Result Value Ref Range Status   MRSA by PCR NEGATIVE NEGATIVE Final    Comment:        The GeneXpert MRSA Assay (FDA approved for NASAL specimens only), is one component of a comprehensive MRSA colonization surveillance program. It is not intended to diagnose MRSA infection nor to guide or monitor treatment for MRSA infections.      Labs: Basic Metabolic Panel:  Recent Labs Lab 10/02/15 2310 10/03/15 1550 10/04/15 0432  NA 136 138 139  K 4.5 4.3 3.6  CL 101 104 106  CO2 23 26 26   GLUCOSE 312* 247* 182*  BUN 14 16 13   CREATININE 1.39* 1.29* 1.21  CALCIUM 8.9 9.1 8.5*  MG  --  1.9  --   PHOS  --  2.4*  --  Liver Function Tests:  Recent Labs Lab 10/02/15 2310 10/03/15 1550  AST 22 20  ALT 23 21  ALKPHOS 59 61  BILITOT 0.9 1.4*  PROT 7.0 7.6  ALBUMIN 3.6 3.9    Recent Labs Lab 10/02/15 2310 10/03/15 1550  LIPASE 39 45   No results for input(s): AMMONIA in the last 168 hours. CBC:  Recent Labs Lab 10/02/15 2310 10/03/15 1550 10/04/15 0432  WBC 9.9 9.0 7.8  NEUTROABS 8.3* 6.5  --   HGB 14.4 15.1 13.8  HCT 41.7 43.3 40.9  MCV 74.6* 75.0* 76.2*  PLT 212 224 209   Cardiac Enzymes: No results for input(s): CKTOTAL, CKMB, CKMBINDEX, TROPONINI in the last 168 hours. BNP: BNP (last 3 results) No results for input(s): BNP in the last 8760 hours.  ProBNP (last 3 results) No results for input(s): PROBNP in the last 8760 hours.  CBG:  Recent Labs Lab 10/02/15 2307 10/03/15 0035 10/03/15 2217 10/04/15 0757 10/04/15 1218  GLUCAP 312*  303* 232* 177* 185*       Signed:  Berton Mount, MD  [facility] Pager #: [phone] 7PM-7AM contact night coverage as above

## 2015-10-04 NOTE — Progress Notes (Signed)
Assessment unchanged. HA subsided with no N/V. Pt and significant other verbalized understanding of dc instructions through teach back regarding follow-up care with PCP with in one week and diet. Script x 1 given as provided by MD. Discharged via wc to front entrance accompanied by NT and girlfriend.

## 2015-10-04 NOTE — Progress Notes (Deleted)
Dr. Hoxworth notified again via text and call that pt wanted to stay and requested stool be checked for C-diff. Pt back to bed after large malodorous loose stool. See new orders in EPIC.  

## 2015-10-04 NOTE — Progress Notes (Signed)
Dr. Dartha Lodgegbata aware no diet order in EPIC. Pt requesting food. Update on pt status given to MD. No nausea noted at this time. See new orders received.

## 2015-10-05 LAB — HEMOGLOBIN A1C
Hgb A1c MFr Bld: 14.1 % — ABNORMAL HIGH (ref 4.8–5.6)
Mean Plasma Glucose: 358 mg/dL

## 2016-01-26 ENCOUNTER — Ambulatory Visit (INDEPENDENT_AMBULATORY_CARE_PROVIDER_SITE_OTHER): Payer: Self-pay | Admitting: Physician Assistant

## 2016-01-26 VITALS — BP 124/76 | HR 88 | Temp 98.4°F | Resp 17 | Ht 73.0 in | Wt 265.0 lb

## 2016-01-26 DIAGNOSIS — Z794 Long term (current) use of insulin: Secondary | ICD-10-CM

## 2016-01-26 DIAGNOSIS — R112 Nausea with vomiting, unspecified: Secondary | ICD-10-CM

## 2016-01-26 DIAGNOSIS — E1165 Type 2 diabetes mellitus with hyperglycemia: Secondary | ICD-10-CM

## 2016-01-26 DIAGNOSIS — R197 Diarrhea, unspecified: Secondary | ICD-10-CM

## 2016-01-26 LAB — POCT CBC
Granulocyte percent: 76.3 %G (ref 37–80)
HCT, POC: 41.9 % — AB (ref 43.5–53.7)
HEMOGLOBIN: 14.8 g/dL (ref 14.1–18.1)
LYMPH, POC: 1.3 (ref 0.6–3.4)
MCH: 27.3 pg (ref 27–31.2)
MCHC: 35.4 g/dL (ref 31.8–35.4)
MCV: 77 fL — AB (ref 80–97)
MID (cbc): 0.2 (ref 0–0.9)
MPV: 8.2 fL (ref 0–99.8)
PLATELET COUNT, POC: 225 10*3/uL (ref 142–424)
POC Granulocyte: 4.7 (ref 2–6.9)
POC LYMPH PERCENT: 21.2 %L (ref 10–50)
POC MID %: 2.5 %M (ref 0–12)
RBC: 5.44 M/uL (ref 4.69–6.13)
RDW, POC: 13.1 %
WBC: 6.2 10*3/uL (ref 4.6–10.2)

## 2016-01-26 LAB — POC MICROSCOPIC URINALYSIS (UMFC): MUCUS RE: ABSENT

## 2016-01-26 LAB — POCT URINALYSIS DIP (MANUAL ENTRY)
BILIRUBIN UA: NEGATIVE
Ketones, POC UA: NEGATIVE
Leukocytes, UA: NEGATIVE
Nitrite, UA: NEGATIVE
Protein Ur, POC: NEGATIVE
SPEC GRAV UA: 1.01
UROBILINOGEN UA: 0.2
pH, UA: 5

## 2016-01-26 LAB — POCT GLYCOSYLATED HEMOGLOBIN (HGB A1C): HEMOGLOBIN A1C: 14

## 2016-01-26 LAB — GLUCOSE, POCT (MANUAL RESULT ENTRY): POC GLUCOSE: 271 mg/dL — AB (ref 70–99)

## 2016-01-26 MED ORDER — METFORMIN HCL 1000 MG PO TABS: 1000.0000 mg | ORAL_TABLET | Freq: Two times a day (BID) | ORAL | 1 refills | Status: DC

## 2016-01-26 MED ORDER — ONDANSETRON 8 MG PO TBDP: 8.0000 mg | ORAL_TABLET | Freq: Three times a day (TID) | ORAL | 0 refills | Status: DC | PRN

## 2016-01-26 NOTE — Patient Instructions (Addendum)
Take half tablet twice per day for three days, then increase to 1 tablet twice per day. Diabetes Mellitus and Food It is important for you to manage your blood sugar (glucose) level. Your blood glucose level can be greatly affected by what you eat. Eating healthier foods in the appropriate amounts throughout the day at about the same time each day will help you control your blood glucose level. It can also help slow or prevent worsening of your diabetes mellitus. Healthy eating may even help you improve the level of your blood pressure and reach or maintain a healthy weight. General recommendations for healthful eating and cooking habits include:  Eating meals and snacks regularly. Avoid going long periods of time without eating to lose weight.  Eating a diet that consists mainly of plant-based foods, such as fruits, vegetables, nuts, legumes, and whole grains.  Using low-heat cooking methods, such as baking, instead of high-heat cooking methods, such as deep frying. Work with your dietitian to make sure you understand how to use the Nutrition Facts information on food labels. How can food affect me? Carbohydrates  Carbohydrates affect your blood glucose level more than any other type of food. Your dietitian will help you determine how many carbohydrates to eat at each meal and teach you how to count carbohydrates. Counting carbohydrates is important to keep your blood glucose at a healthy level, especially if you are using insulin or taking certain medicines for diabetes mellitus. Alcohol  Alcohol can cause sudden decreases in blood glucose (hypoglycemia), especially if you use insulin or take certain medicines for diabetes mellitus. Hypoglycemia can be a life-threatening condition. Symptoms of hypoglycemia (sleepiness, dizziness, and disorientation) are similar to symptoms of having too much alcohol. If your health care provider has given you approval to drink alcohol, do so in moderation and use  the following guidelines:  Women should not have more than one drink per day, and men should not have more than two drinks per day. One drink is equal to:  12 oz of beer.  5 oz of wine.  1 oz of hard liquor.  Do not drink on an empty stomach.  Keep yourself hydrated. Have water, diet soda, or unsweetened iced tea.  Regular soda, juice, and other mixers might contain a lot of carbohydrates and should be counted. What foods are not recommended? As you make food choices, it is important to remember that all foods are not the same. Some foods have fewer nutrients per serving than other foods, even though they might have the same number of calories or carbohydrates. It is difficult to get your body what it needs when you eat foods with fewer nutrients. Examples of foods that you should avoid that are high in calories and carbohydrates but low in nutrients include:  Trans fats (most processed foods list trans fats on the Nutrition Facts label).  Regular soda.  Juice.  Candy.  Sweets, such as cake, pie, doughnuts, and cookies.  Fried foods. What foods can I eat? Eat nutrient-rich foods, which will nourish your body and keep you healthy. The food you should eat also will depend on several factors, including:  The calories you need.  The medicines you take.  Your weight.  Your blood glucose level.  Your blood pressure level.  Your cholesterol level. You should eat a variety of foods, including:  Protein.  Lean cuts of meat.  Proteins low in saturated fats, such as fish, egg whites, and beans. Avoid processed meats.  Fruits and  vegetables.  Fruits and vegetables that may help control blood glucose levels, such as apples, mangoes, and yams.  Dairy products.  Choose fat-free or low-fat dairy products, such as milk, yogurt, and cheese.  Grains, bread, pasta, and rice.  Choose whole grain products, such as multigrain bread, whole oats, and brown rice. These foods may  help control blood pressure.  Fats.  Foods containing healthful fats, such as nuts, avocado, olive oil, canola oil, and fish. Does everyone with diabetes mellitus have the same meal plan? Because every person with diabetes mellitus is different, there is not one meal plan that works for everyone. It is very important that you meet with a dietitian who will help you create a meal plan that is just right for you. This information is not intended to replace advice given to you by your health care provider. Make sure you discuss any questions you have with your health care provider. Document Released: 10/22/2004 Document Revised: 07/03/2015 Document Reviewed: 12/22/2012 Elsevier Interactive Patient Education  2017 [employer]     IF you received an x-ray today, you will receive an invoice from [facility]. Please contact [facility] at [phone] with questions or concerns regarding your invoice.   IF you received labwork today, you will receive an invoice from [facility]. Please contact [facility] at [phone] with questions or concerns regarding your invoice.   Our billing staff will not be able to assist you with questions regarding bills from these companies.  You will be contacted with the lab results as soon as they are available. The fastest way to get your results is to activate your My Chart account. Instructions are located on the last page of this paperwork. If you have not heard from us regarding the results in 2 weeks, please contact this office.

## 2016-01-26 NOTE — Progress Notes (Signed)
[facility] [address], [place] [address] [phone]  Date:  01/26/2016   Name:  Timothy Heath   DOB:  1975-10-18   MRN:  [id number]  PCP:  Aura DialsBOUSKA,DAVID E, MD   Chief Complaint  Patient presents with  . Diarrhea  . Nausea    History of Present Illness:  Timothy Heath is a 40 y.o. male patient [facility] and [facility].  Symptoms began 4 days ago with nausea and emesis, followed by 4-5 episodes of non-bloody watery like diarrhea.  More episodes the next day. Coughing initially present, but has resolved.  No sore throat (child with hx of strep 2 weeks ago). No fever or dizziness.  No hematuria, dysuria, or frequency.  Abdominal cramping was present with initial symptoms but has resolved.  Patient has been out of the diabetic medication.  His insurance fell through and he has not been able to pay for refill.  He can not afford his lantus until next month.    Patient Active Problem List   Diagnosis Date Noted  . Nausea and vomiting 10/03/2015  . DM 10/31/2006  . HYPERTENSION 10/31/2006  . ASTHMA 10/31/2006    Past Medical History:  Diagnosis Date  . Allergy   . Asthma   . Diabetes mellitus   . Hypertension   . Noncompliance     Past Surgical History:  Procedure Laterality Date  . APPENDECTOMY    . FRACTURE SURGERY    . HERNIA REPAIR    . KNEE SURGERY      Social History  Substance Use Topics  . Smoking status: Never Smoker  . Smokeless tobacco: Never Used  . Alcohol use No    Family History  Problem Relation Age of Onset  . Diabetes Mother   . Hypertension Mother   . Kidney disease Father   . Hypertension Father   . Diabetes Maternal Grandmother   . Hypertension Maternal Grandmother   . Stroke Maternal Grandmother   . Kidney disease Maternal Grandmother   . Depression Brother     Allergies  Allergen Reactions  . Bee Venom Anaphylaxis  . Penicillins Anaphylaxis and Shortness Of Breath    Has  patient had a PCN reaction causing immediate rash, facial/tongue/throat swelling, SOB or lightheadedness with hypotension:YES Has patient had a PCN reaction causing severe rash involving mucus membranes or skin necrosis:  NO Has patient had a PCN reaction that required hospitalization YES Has patient had a PCN reaction occurring within the last 10 years: NO If all of the above answers are "NO", then may proceed with Cephalosporin use.     Medication list has been reviewed and updated.  Current Outpatient Prescriptions on File Prior to Visit  Medication Sig Dispense Refill  . insulin glargine (LANTUS) 100 UNIT/ML injection Inject 20 Units into the skin at bedtime.    . metoCLOPramide (REGLAN) 5 MG tablet Take 1 tablet (5 mg total) by mouth every 6 (six) hours as needed for nausea or vomiting. 120 tablet 1  . pravastatin (PRAVACHOL) 20 MG tablet Take 20 mg by mouth daily.    . SUMAtriptan (IMITREX) 50 MG tablet Take 1 tablet (50 mg total) by mouth every 2 (two) hours as needed for migraine or headache (Maximum 200mg  in one day). May repeat in 2 hours if headache persists or recurs. 20 tablet 0  . lisinopril (ZESTRIL) 10 MG tablet Take 1 tablet (10 mg total) by mouth daily. (  Patient not taking: Reported on 01/26/2016) 30 tablet 1  . metFORMIN (GLUCOPHAGE) 1000 MG tablet Take 1 tablet (1,000 mg total) by mouth 2 (two) times daily with a meal. (Patient not taking: Reported on 01/26/2016) 180 tablet 3   No current facility-administered medications on file prior to visit.     ROS ROS otherwise unremarkable unless listed above.   Physical Examination: BP 124/76 (BP Location: Right Arm, Patient Position: Sitting, Cuff Size: Normal)   Pulse 88   Temp 98.4 F (36.9 C) (Oral)   Resp 17   Ht 6\' 1"  (1.854 m)   Wt 265 lb (120.2 kg)   SpO2 97%   BMI 34.96 kg/m  Ideal Body Weight: Weight in (lb) to have BMI = 25: 189.1  Physical Exam  Constitutional: He is oriented to person, place, and time.  He appears well-developed and well-nourished. No distress.  HENT:  Head: Normocephalic and atraumatic.  Nose: Mucosal edema and rhinorrhea present.  Eyes: Conjunctivae and EOM are normal. Pupils are equal, round, and reactive to light.  Cardiovascular: Normal rate and regular rhythm.  Exam reveals no friction rub.   No murmur heard. Pulmonary/Chest: Effort normal and breath sounds normal. No apnea. No respiratory distress. He has no decreased breath sounds. He has no wheezes.  Abdominal: Soft. Normal appearance and bowel sounds are normal. There is no tenderness. There is no tenderness at McBurney's point and negative Murphy's sign.  Neurological: He is alert and oriented to person, place, and time.  Skin: Skin is warm and dry. He is not diaphoretic.  Psychiatric: He has a normal mood and affect. His behavior is normal.   Results for orders placed or performed in visit on 01/26/16  POCT CBC  Result Value Ref Range   WBC 6.2 4.6 - 10.2 K/uL   Lymph, poc 1.3 0.6 - 3.4   POC LYMPH PERCENT 21.2 10 - 50 %L   MID (cbc) 0.2 0 - 0.9   POC MID % 2.5 0 - 12 %M   POC Granulocyte 4.7 2 - 6.9   Granulocyte percent 76.3 37 - 80 %G   RBC 5.44 4.69 - 6.13 M/uL   Hemoglobin 14.8 14.1 - 18.1 g/dL   HCT, POC 62.941.9 (A) 52.843.5 - 53.7 %   MCV 77.0 (A) 80 - 97 fL   MCH, POC 27.3 27 - 31.2 pg   MCHC 35.4 31.8 - 35.4 g/dL   RDW, POC 41.313.1 %   Platelet Count, POC 225 142 - 424 K/uL   MPV 8.2 0 - 99.8 fL  POCT Microscopic Urinalysis ([facility])  Result Value Ref Range   WBC,UR,HPF,POC None None WBC/hpf   RBC,UR,HPF,POC Few (A) None RBC/hpf   Bacteria None None, Too numerous to count   Mucus Absent Absent   Epithelial Cells, UR Per Microscopy Few (A) None, Too numerous to count cells/hpf  POCT urinalysis dipstick  Result Value Ref Range   Color, UA yellow yellow   Clarity, UA clear clear   Glucose, UA >=1,000 (A) negative   Bilirubin, UA negative negative   Ketones, POC UA negative negative   Spec Grav, UA  1.010    Blood, UA trace-intact (A) negative   pH, UA 5.0    Protein Ur, POC negative negative   Urobilinogen, UA 0.2    Nitrite, UA Negative Negative   Leukocytes, UA Negative Negative  POCT glucose (manual entry)  Result Value Ref Range   POC Glucose 271 (A) 70 - 99 mg/dl  POCT glycosylated  hemoglobin (Hb A1C)  Result Value Ref Range   Hemoglobin A1C 14.0       Assessment and Plan: Timothy Heath is a 40 y.o. male who is here today for cc of nausea and diarrhea.  Appears to likely be viral. Though this could likely be the uncontrolled type 2 diabetes. Given zofran. Refilling metformin at this time.  Advised that he return in 1 month for follow up.  Nausea and vomiting, intractability of vomiting not specified, unspecified vomiting type - Plan: POCT CBC, POCT Microscopic Urinalysis ([facility]), POCT urinalysis dipstick, POCT glucose (manual entry), POCT glycosylated hemoglobin (Hb A1C)  Diarrhea, unspecified type - Plan: POCT CBC, POCT glucose (manual entry), POCT glycosylated hemoglobin (Hb A1C)  Uncontrolled type 2 diabetes mellitus with hyperglycemia, with long-term current use of insulin (HCC) - Plan: metFORMIN (GLUCOPHAGE) 1000 MG tablet  Trena Platt, PA-C [facility] 01/26/2016 8:39 AM

## 2016-02-18 ENCOUNTER — Ambulatory Visit (INDEPENDENT_AMBULATORY_CARE_PROVIDER_SITE_OTHER): Payer: Self-pay | Admitting: Physician Assistant

## 2016-02-18 VITALS — BP 142/90 | HR 93 | Temp 97.6°F | Resp 18 | Ht 72.5 in | Wt 262.2 lb

## 2016-02-18 DIAGNOSIS — L84 Corns and callosities: Secondary | ICD-10-CM

## 2016-02-18 NOTE — Progress Notes (Signed)
[facility] [address], [place] [id number] [phone]  Date:  02/18/2016   Name:  Timothy Heath   DOB:  [birth year]   MRN:  [id number]  PCP:  Aura DialsBOUSKA,DAVID E, MD   Chief Complaint  Patient presents with  . Follow-up    Left foot, laceration     History of Present Illness:  Timothy Heath is a 41 y.o. male patient who [facility] of [facility] foot laceration   About 6 weeks ago, he was running outside and cut his foot.  The laceration was infected and he went to a nearby urgent care facility.  He was given clindamycin, and this healed.  He states that he was at work yesterday, when he noticed pain in his foot.  This pain had resolved, but he was concerned that the infection resurfaced.  No fever.  He denies any swelling or drainage.  He is on his feet for long periods.  He was seen here 3 weeks ago for diarrhea.  Blood sugar elevated and a1c at 14.  Placed back on metformin, and advised to return in 1 month.  He is trying to hold off in more medicines until his insurance kicks in at the end of this month, despite providers recommendations.  He states that his blood sugar is now in the low 200s.   Patient Active Problem List   Diagnosis Date Noted  . Nausea and vomiting 10/03/2015  . DM 10/31/2006  . HYPERTENSION 10/31/2006  . ASTHMA 10/31/2006    Past Medical History:  Diagnosis Date  . Allergy   . Asthma   . Diabetes mellitus   . Hypertension   . Noncompliance     Past Surgical History:  Procedure Laterality Date  . APPENDECTOMY    . FRACTURE SURGERY    . HERNIA REPAIR    . KNEE SURGERY      Social History  Substance Use Topics  . Smoking status: Never Smoker  . Smokeless tobacco: Never Used  . Alcohol use No    Family History  Problem Relation Age of Onset  . Diabetes Mother   . Hypertension Mother   . Kidney disease Father   . Hypertension Father   . Diabetes Maternal Grandmother   . Hypertension Maternal  Grandmother   . Stroke Maternal Grandmother   . Kidney disease Maternal Grandmother   . Depression Brother     Allergies  Allergen Reactions  . Bee Venom Anaphylaxis  . Penicillins Anaphylaxis and Shortness Of Breath    Has patient had a PCN reaction causing immediate rash, facial/tongue/throat swelling, SOB or lightheadedness with hypotension:YES Has patient had a PCN reaction causing severe rash involving mucus membranes or skin necrosis:  NO Has patient had a PCN reaction that required hospitalization YES Has patient had a PCN reaction occurring within the last 10 years: NO If all of the above answers are "NO", then may proceed with Cephalosporin use.     Medication list has been reviewed and updated.  Current Outpatient Prescriptions on File Prior to Visit  Medication Sig Dispense Refill  . insulin glargine (LANTUS) 100 UNIT/ML injection Inject 20 Units into the skin at bedtime.    Marland Kitchen. lisinopril (ZESTRIL) 10 MG tablet Take 1 tablet (10 mg total) by mouth daily. 30 tablet 1  . metFORMIN (GLUCOPHAGE) 1000 MG tablet Take 1 tablet (1,000 mg total) by mouth 2 (two) times daily with a meal. 60 tablet 1  .  metoCLOPramide (REGLAN) 5 MG tablet Take 1 tablet (5 mg total) by mouth every 6 (six) hours as needed for nausea or vomiting. 120 tablet 1  . ondansetron (ZOFRAN-ODT) 8 MG disintegrating tablet Take 1 tablet (8 mg total) by mouth every 8 (eight) hours as needed for nausea. 20 tablet 0  . pravastatin (PRAVACHOL) 20 MG tablet Take 20 mg by mouth daily.    . SUMAtriptan (IMITREX) 50 MG tablet Take 1 tablet (50 mg total) by mouth every 2 (two) hours as needed for migraine or headache (Maximum 200mg  in one day). May repeat in 2 hours if headache persists or recurs. 20 tablet 0   No current facility-administered medications on file prior to visit.     ROS ROS otherwise unremarkable unless listed above.   Physical Examination: BP (!) 142/90   Pulse 93   Temp 97.6 F (36.4 C) (Oral)    Resp 18   Ht 6' 0.5" (1.842 m)   Wt 262 lb 3.2 oz (118.9 kg)   SpO2 98%   BMI 35.07 kg/m  Ideal Body Weight: Weight in (lb) to have BMI = 25: 186.5  Physical Exam  Constitutional: He is oriented to person, place, and time. He appears well-developed and well-nourished. No distress.  HENT:  Head: Normocephalic and atraumatic.  Eyes: Conjunctivae and EOM are normal. Pupils are equal, round, and reactive to light.  Cardiovascular: Normal rate and regular rhythm.   Pulmonary/Chest: Effort normal. No respiratory distress.  Feet:  Right Foot:  Skin Integrity: Negative for ulcer or blister.  Left Foot:  Skin Integrity: Positive for callus (callus thickening at the plantar 1st metatarsal.  no erythema, or purulent fluid.  no tenderness, hardened with multiple laters, scant callusing at the plantar area of the 4-5th mtp as well. ). Negative for ulcer or blister.  Neurological: He is alert and oriented to person, place, and time.  Skin: Skin is warm and dry. He is not diaphoretic.  Psychiatric: He has a normal mood and affect. His behavior is normal.     Assessment and Plan: Timothy Heath is a 41 y.o. male who is here today for cc of foot laceration. This pain was likely due to heavy ambulation on hard surface.  Encouraged appropriate cushioning in shoes.  Advised pumice stone and soaks every other day.  Callus of foot  Trena Platt, PA-C [facility] 1/11/20188:37 AM

## 2016-02-18 NOTE — Patient Instructions (Addendum)
This does not look like worsening infection.  You can use a pumice stone every other day to break down the callus.   Make sure you have proper cushioning in your shoes, to absorb the shock with day to day walking and movement.   Corns and Calluses Introduction Corns are small areas of thickened skin that occur on the top, sides, or tip of a toe. They contain a cone-shaped core with a point that can press on a nerve below. This causes pain. Calluses are areas of thickened skin that can occur anywhere on the body including hands, fingers, palms, soles of the feet, and heels.Calluses are usually larger than corns. What are the causes? Corns and calluses are caused by rubbing (friction) or pressure, such as from shoes that are too tight or do not fit properly. What increases the risk? Corns are more likely to develop in people who have toe deformities, such as hammer toes. Since calluses can occur with friction to any area of the skin, calluses are more likely to develop in people who:  Work with their hands.  Wear shoes that fit poorly, shoes that are too tight, or shoes that are high-heeled.  Have toes deformities. What are the signs or symptoms? Symptoms of a corn or callus include:  A hard growth on the skin.  Pain or tenderness under the skin.  Redness and swelling.  Increased discomfort while wearing tight-fitting shoes. How is this diagnosed? Corns and calluses may be diagnosed with a medical history and physical exam. How is this treated? Corns and calluses may be treated with:  Removing the cause of the friction or pressure. This may include:  Changing your shoes.  Wearing shoe inserts (orthotics) or other protective layers in your shoes, such as a corn pad.  Wearing gloves.  Medicines to help soften skin in the hardened, thickened areas.  Reducing the size of the corn or callus by removing the dead layers of skin.  Antibiotic medicines to treat  infection.  Surgery, if a toe deformity is the cause. Follow these instructions at home:  Take medicines only as directed by your health care provider.  If you were prescribed an antibiotic, finish all of it even if you start to feel better.  Wear shoes that fit well. Avoid wearing high-heeled shoes and shoes that are too tight or too loose.  Wear any padding, protective layers, gloves, or orthotics as directed by your health care provider.  Soak your hands or feet and then use a file or pumice stone to soften your corn or callus. Do this as directed by your health care provider.  Check your corn or callus every day for signs of infection. Watch for:  Redness, swelling, or pain.  Fluid, blood, or pus. Contact a health care provider if:  Your symptoms do not improve with treatment.  You have increased redness, swelling, or pain at the site of your corn or callus.  You have fluid, blood, or pus coming from your corn or callus.  You have new symptoms. This information is not intended to replace advice given to you by your health care provider. Make sure you discuss any questions you have with your health care provider. Document Released: 11/01/2003 Document Revised: 08/15/2015 Document Reviewed: 01/21/2014  2017 Elsevier    IF you received an x-ray today, you will receive an invoice from [facility]. Please contact [facility] at [phone] with questions or concerns regarding your invoice.   IF you received labwork today,  you will receive an invoice from [employer]. Please contact [facility] at [phone] with questions or concerns regarding your invoice.   Our billing staff will not be able to assist you with questions regarding bills from these companies.  You will be contacted with the lab results as soon as they are available. The fastest way to get your results is to activate your My Chart account. Instructions are located on the last page of this  paperwork. If you have not heard from [place] regarding the results in 2 weeks, please contact this office.

## 2016-03-02 ENCOUNTER — Ambulatory Visit: Payer: Self-pay

## 2016-06-17 ENCOUNTER — Emergency Department (HOSPITAL_BASED_OUTPATIENT_CLINIC_OR_DEPARTMENT_OTHER)
Admission: EM | Admit: 2016-06-17 | Discharge: 2016-06-17 | Disposition: A | Discharge status: Home / Self Care | Payer: Managed Care, Other (non HMO) | Attending: Emergency Medicine | Admitting: Emergency Medicine

## 2016-06-17 ENCOUNTER — Encounter (HOSPITAL_BASED_OUTPATIENT_CLINIC_OR_DEPARTMENT_OTHER): Payer: Self-pay | Admitting: *Deleted

## 2016-06-17 DIAGNOSIS — J45909 Unspecified asthma, uncomplicated: Secondary | ICD-10-CM | POA: Insufficient documentation

## 2016-06-17 DIAGNOSIS — Z79899 Other long term (current) drug therapy: Secondary | ICD-10-CM | POA: Insufficient documentation

## 2016-06-17 DIAGNOSIS — H1012 Acute atopic conjunctivitis, left eye: Secondary | ICD-10-CM

## 2016-06-17 DIAGNOSIS — I10 Essential (primary) hypertension: Secondary | ICD-10-CM | POA: Insufficient documentation

## 2016-06-17 DIAGNOSIS — Z7984 Long term (current) use of oral hypoglycemic drugs: Secondary | ICD-10-CM | POA: Insufficient documentation

## 2016-06-17 DIAGNOSIS — E119 Type 2 diabetes mellitus without complications: Secondary | ICD-10-CM | POA: Insufficient documentation

## 2016-06-17 MED ORDER — NAPHAZOLINE-PHENIRAMINE 0.025-0.3 % OP SOLN: 1.0000 [drp] | OPHTHALMIC | 0 refills | Status: DC | PRN

## 2016-06-17 MED ORDER — TETRACAINE HCL 0.5 % OP SOLN
1.0000 [drp] | Freq: Once | OPHTHALMIC | Status: AC
  Administered 2016-06-17: 1 [drp] via OPHTHALMIC
  Filled 2016-06-17: qty 4

## 2016-06-17 MED ORDER — FLUORESCEIN SODIUM 0.6 MG OP STRP
1.0000 | ORAL_STRIP | Freq: Once | OPHTHALMIC | Status: AC
  Administered 2016-06-17: 1 via OPHTHALMIC
  Filled 2016-06-17: qty 1

## 2016-06-17 NOTE — ED Provider Notes (Signed)
[facility] Provider Note   CSN: [id number] Arrival date & time: 06/17/16  1639   By signing my name below, I, Talbert Nan, attest that this documentation has been prepared under the direction and in the presence of Melburn Hake, [place]. Electronically Signed: Talbert Nan, Scribe. 06/17/16. 5:04 PM.  History   Chief Complaint Chief Complaint  Patient presents with  . Eye Problem    HPI Timothy Heath is a 41 y.o. male who presents to the Emergency Department complaining of left eye pain with redness that has been bothering him for about a week. Pt denies drainage, gritty sensation in eye. Pt has associated photophobia and itching of his left eye. Pt denies any injury to either eye. Pt reports that the irritation has moved from one eye to the other over the past week. Pt states that his eyes only bother him when he is outside. Pt has used visine with limited relief. 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 Pt denies fever, headache, changes in vision, facial swelling. Denies use of contacts.    The history is provided by the patient. No language interpreter was used.    Past Medical History:  Diagnosis Date  . Allergy   . Asthma   . Diabetes mellitus   . Hypertension   . Noncompliance     Patient Active Problem List   Diagnosis Date Noted  . Nausea and vomiting 10/03/2015  . DM 10/31/2006  . HYPERTENSION 10/31/2006  . ASTHMA 10/31/2006    Past Surgical History:  Procedure Laterality Date  . APPENDECTOMY    . FRACTURE SURGERY    . HERNIA REPAIR    . KNEE SURGERY         Home Medications    Prior to Admission medications   Medication Sig Start Date End Date Taking? Authorizing Provider  insulin glargine (LANTUS) 100 UNIT/ML injection Inject 20 Units into the skin at bedtime.    [provider]  lisinopril (ZESTRIL) 10 MG tablet Take 1 tablet (10 mg total) by mouth daily. 10/04/15   Barnetta Chapel, MD  metFORMIN (GLUCOPHAGE) 1000 MG tablet Take 1  tablet (1,000 mg total) by mouth 2 (two) times daily with a meal. 01/26/16   English, Judeth Cornfield D, PA  metoCLOPramide (REGLAN) 5 MG tablet Take 1 tablet (5 mg total) by mouth every 6 (six) hours as needed for nausea or vomiting. 10/04/15   Barnetta Chapel, MD  naphazoline-pheniramine (NAPHCON-A) 0.025-0.3 % ophthalmic solution Place 1 drop into the left eye every 4 (four) hours as needed for irritation. 06/17/16   Barrett Henle, PA-C  ondansetron (ZOFRAN-ODT) 8 MG disintegrating tablet Take 1 tablet (8 mg total) by mouth every 8 (eight) hours as needed for nausea. 01/26/16   Trena Platt D, PA  pravastatin (PRAVACHOL) 20 MG tablet Take 20 mg by mouth daily. 05/19/15 05/18/16  [provider]  SUMAtriptan (IMITREX) 50 MG tablet Take 1 tablet (50 mg total) by mouth every 2 (two) hours as needed for migraine or headache (Maximum 200mg  in one day). May repeat in 2 hours if headache persists or recurs. 10/04/15   Barnetta Chapel, MD    Family History Family History  Problem Relation Age of Onset  . Diabetes Mother   . Hypertension Mother   . Kidney disease Father   . Hypertension Father   . Diabetes Maternal Grandmother   . Hypertension Maternal Grandmother   . Stroke Maternal Grandmother   . Kidney disease Maternal Grandmother   .  Depression Brother     Social History Social History  Substance Use Topics  . Smoking status: Never Smoker  . Smokeless tobacco: Never Used  . Alcohol use No     Allergies   Bee venom and Penicillins   Review of Systems Review of Systems  Constitutional: Negative for fever.  Eyes: Positive for photophobia, redness and itching. Negative for pain, discharge and visual disturbance.     Physical Exam Updated Vital Signs BP (!) 147/110 (BP Location: Left Arm)   Pulse 90   Temp 98.8 F (37.1 C) (Oral)   Resp 18   SpO2 100%   Physical Exam  Constitutional: He is oriented to person, place, and time. He appears  well-developed and well-nourished.  HENT:  Head: Normocephalic and atraumatic.  No facial swelling.  Eyes: EOM and lids are normal. Pupils are equal, round, and reactive to light. Lids are everted and swept, no foreign bodies found. Right eye exhibits no chemosis, no discharge, no exudate and no hordeolum. No foreign body present in the right eye. Left eye exhibits no chemosis, no discharge, no exudate and no hordeolum. No foreign body present in the left eye. Right conjunctiva is not injected. Right conjunctiva has no hemorrhage. Left conjunctiva is injected. Left conjunctiva has no hemorrhage. No scleral icterus.  Slit lamp exam:      The left eye shows no corneal abrasion, no corneal flare, no corneal ulcer, no foreign body, no hyphema and no fluorescein uptake.  Neck: Normal range of motion. Neck supple.  Cardiovascular: Normal rate.   Pulmonary/Chest: Effort normal.  Neurological: He is alert and oriented to person, place, and time.  Skin: Skin is warm and dry.  Psychiatric: He has a normal mood and affect.  Nursing note and vitals reviewed.    ED Treatments / Results   DIAGNOSTIC STUDIES: Oxygen Saturation is 100% on room air, normal by my interpretation.    COORDINATION OF CARE: 5:00 PM Discussed treatment plan with pt at bedside and pt agreed to plan, which includes eye exam for foreign bodies.    Labs (all labs ordered are listed, but only abnormal results are displayed) Labs Reviewed - No data to display  EKG  EKG Interpretation None       Radiology No results found.  Procedures Procedures (including critical care time)  Medications Ordered in ED Medications  fluorescein ophthalmic strip 1 strip (1 strip Left Eye Given by Other 06/17/16 1706)  tetracaine (PONTOCAINE) 0.5 % ophthalmic solution 1 drop (1 drop Left Eye Given by Other 06/17/16 1706)     Initial Impression / Assessment and Plan / ED Course  I have reviewed the triage vital signs and the nursing  notes.  Pertinent labs & imaging results that were available during my care of the patient were reviewed by me and considered in my medical decision making (see chart for details).     Patient presentation consistent with allergic conjunctivitis.  No purulent discharge, corneal abrasions, entrapment, consensual photophobia, or dendritic staining with fluorescein study.  Presentation non-concerning for iritis, bacterial conjunctivitis, corneal abrasions, or HSV.  No antibiotics are indicated and patient will be prescribed naphazoline for itching.  Personal hygiene and frequent handwashing discussed.  Patient advised to followup with ophthalmologist if symptoms persist or worsen in any way including vision change or purulent discharge.  Patient verbalizes understanding and is agreeable with discharge.   Final Clinical Impressions(s) / ED Diagnoses   Final diagnoses:  Allergic conjunctivitis of left eye  New Prescriptions New Prescriptions   NAPHAZOLINE-PHENIRAMINE (NAPHCON-A) 0.025-0.3 % OPHTHALMIC SOLUTION    Place 1 drop into the left eye every 4 (four) hours as needed for irritation.   I personally performed the services described in this documentation, which was scribed in my presence. The recorded information has been reviewed and is accurate.     Barrett Henle, PA-C 06/17/16 1727    Gwyneth Sprout, MD 06/17/16 [phone]

## 2016-06-17 NOTE — ED Notes (Signed)
Pt verbalized understanding of discharge instructions and denies any further questions at this time.   

## 2016-06-17 NOTE — ED Notes (Signed)
EMT, Madaline GuthrieFernando with patient for visual acuity screening.

## 2016-06-17 NOTE — Discharge Instructions (Signed)
I recommend using eyedrops as prescribed as needed for redness and itching. He may also apply cool compresses for 15-20 minutes 3-4 times daily for additional relief. I recommend fine up with the eye doctor listed below if your symptoms are not improved over the next 3-4 days. Return to the emergency department if symptoms worsen or new onset of fever, facial swelling, visual changes, eye drainage, crusting, headache, dizziness.

## 2016-06-17 NOTE — ED Triage Notes (Signed)
Patient is alert and oriented x4. He is complaining of left eye pain with redness.  Patient states that he doesn't not have allergies but when he goes outside he notices a difference with his eye twitching. Currently he rates his pain 2 of 10 inside but outside 8 of 10.

## 2016-11-29 ENCOUNTER — Emergency Department (HOSPITAL_COMMUNITY): Payer: 59

## 2016-11-29 ENCOUNTER — Encounter (HOSPITAL_BASED_OUTPATIENT_CLINIC_OR_DEPARTMENT_OTHER): Payer: Self-pay | Admitting: *Deleted

## 2016-11-29 ENCOUNTER — Emergency Department (HOSPITAL_COMMUNITY)
Admission: EM | Admit: 2016-11-29 | Discharge: 2016-11-29 | Disposition: A | Discharge status: Home / Self Care | Payer: 59 | Source: Home / Self Care | Attending: Emergency Medicine | Admitting: Emergency Medicine

## 2016-11-29 ENCOUNTER — Inpatient Hospital Stay (HOSPITAL_BASED_OUTPATIENT_CLINIC_OR_DEPARTMENT_OTHER)
Admission: EM | Admit: 2016-11-29 | Discharge: 2016-12-03 | DRG: 638 | Disposition: A | Discharge status: Home / Self Care | Payer: 59 | Attending: Internal Medicine | Admitting: Internal Medicine

## 2016-11-29 ENCOUNTER — Encounter (HOSPITAL_COMMUNITY): Payer: Self-pay | Admitting: Pharmacy Technician

## 2016-11-29 DIAGNOSIS — I10 Essential (primary) hypertension: Secondary | ICD-10-CM | POA: Insufficient documentation

## 2016-11-29 DIAGNOSIS — E1165 Type 2 diabetes mellitus with hyperglycemia: Secondary | ICD-10-CM

## 2016-11-29 DIAGNOSIS — R6889 Other general symptoms and signs: Secondary | ICD-10-CM | POA: Insufficient documentation

## 2016-11-29 DIAGNOSIS — L97529 Non-pressure chronic ulcer of other part of left foot with unspecified severity: Secondary | ICD-10-CM | POA: Diagnosis present

## 2016-11-29 DIAGNOSIS — Z833 Family history of diabetes mellitus: Secondary | ICD-10-CM

## 2016-11-29 DIAGNOSIS — R509 Fever, unspecified: Secondary | ICD-10-CM | POA: Insufficient documentation

## 2016-11-29 DIAGNOSIS — Z7984 Long term (current) use of oral hypoglycemic drugs: Secondary | ICD-10-CM

## 2016-11-29 DIAGNOSIS — L0291 Cutaneous abscess, unspecified: Secondary | ICD-10-CM

## 2016-11-29 DIAGNOSIS — I129 Hypertensive chronic kidney disease with stage 1 through stage 4 chronic kidney disease, or unspecified chronic kidney disease: Secondary | ICD-10-CM | POA: Diagnosis present

## 2016-11-29 DIAGNOSIS — L02612 Cutaneous abscess of left foot: Secondary | ICD-10-CM | POA: Diagnosis present

## 2016-11-29 DIAGNOSIS — N183 Chronic kidney disease, stage 3 unspecified: Secondary | ICD-10-CM | POA: Diagnosis present

## 2016-11-29 DIAGNOSIS — E1122 Type 2 diabetes mellitus with diabetic chronic kidney disease: Secondary | ICD-10-CM | POA: Diagnosis present

## 2016-11-29 DIAGNOSIS — Z6833 Body mass index (BMI) 33.0-33.9, adult: Secondary | ICD-10-CM

## 2016-11-29 DIAGNOSIS — E11621 Type 2 diabetes mellitus with foot ulcer: Secondary | ICD-10-CM | POA: Diagnosis not present

## 2016-11-29 DIAGNOSIS — R05 Cough: Secondary | ICD-10-CM

## 2016-11-29 DIAGNOSIS — J45909 Unspecified asthma, uncomplicated: Secondary | ICD-10-CM | POA: Diagnosis present

## 2016-11-29 DIAGNOSIS — Z794 Long term (current) use of insulin: Secondary | ICD-10-CM

## 2016-11-29 DIAGNOSIS — L97509 Non-pressure chronic ulcer of other part of unspecified foot with unspecified severity: Secondary | ICD-10-CM

## 2016-11-29 DIAGNOSIS — Z91030 Bee allergy status: Secondary | ICD-10-CM

## 2016-11-29 DIAGNOSIS — Z880 Allergy status to penicillin: Secondary | ICD-10-CM

## 2016-11-29 DIAGNOSIS — Z79899 Other long term (current) drug therapy: Secondary | ICD-10-CM

## 2016-11-29 DIAGNOSIS — R739 Hyperglycemia, unspecified: Secondary | ICD-10-CM

## 2016-11-29 DIAGNOSIS — N179 Acute kidney failure, unspecified: Secondary | ICD-10-CM | POA: Diagnosis present

## 2016-11-29 DIAGNOSIS — L03116 Cellulitis of left lower limb: Secondary | ICD-10-CM | POA: Diagnosis present

## 2016-11-29 LAB — CBC WITH DIFFERENTIAL/PLATELET
BASOS ABS: 0 10*3/uL (ref 0.0–0.1)
Basophils Absolute: 0 10*3/uL (ref 0.0–0.1)
Basophils Relative: 0 %
Basophils Relative: 0 %
EOS ABS: 0 10*3/uL (ref 0.0–0.7)
EOS PCT: 0 %
Eosinophils Absolute: 0.1 10*3/uL (ref 0.0–0.7)
Eosinophils Relative: 1 %
HCT: 37.5 % — ABNORMAL LOW (ref 39.0–52.0)
HCT: 38.9 % — ABNORMAL LOW (ref 39.0–52.0)
Hemoglobin: 12.8 g/dL — ABNORMAL LOW (ref 13.0–17.0)
Hemoglobin: 13 g/dL (ref 13.0–17.0)
LYMPHS PCT: 4 %
Lymphocytes Relative: 4 %
Lymphs Abs: 0.6 10*3/uL — ABNORMAL LOW (ref 0.7–4.0)
Lymphs Abs: 0.8 10*3/uL (ref 0.7–4.0)
MCH: 25.5 pg — ABNORMAL LOW (ref 26.0–34.0)
MCH: 25.9 pg — AB (ref 26.0–34.0)
MCHC: 33.4 g/dL (ref 30.0–36.0)
MCHC: 34.1 g/dL (ref 30.0–36.0)
MCV: 75.8 fL — ABNORMAL LOW (ref 78.0–100.0)
MCV: 76.3 fL — ABNORMAL LOW (ref 78.0–100.0)
MONOS PCT: 3 %
Monocytes Absolute: 0.6 10*3/uL (ref 0.1–1.0)
Monocytes Absolute: 2.2 10*3/uL — ABNORMAL HIGH (ref 0.1–1.0)
Monocytes Relative: 11 %
NEUTROS ABS: 14.9 10*3/uL — AB (ref 1.7–7.7)
NEUTROS PCT: 93 %
Neutro Abs: 17 10*3/uL — ABNORMAL HIGH (ref 1.7–7.7)
Neutrophils Relative %: 84 %
PLATELETS: 212 10*3/uL (ref 150–400)
Platelets: 221 10*3/uL (ref 150–400)
RBC: 5.1 MIL/uL (ref 4.22–5.81)
RBC: 4.95 MIL/uL (ref 4.22–5.81)
RDW: 13.4 % (ref 11.5–15.5)
RDW: 13.4 % (ref 11.5–15.5)
WBC: 16 10*3/uL — ABNORMAL HIGH (ref 4.0–10.5)
WBC: 20.2 10*3/uL — ABNORMAL HIGH (ref 4.0–10.5)

## 2016-11-29 LAB — BASIC METABOLIC PANEL
Anion gap: 9 (ref 5–15)
BUN: 12 mg/dL (ref 6–20)
CO2: 26 mmol/L (ref 22–32)
Calcium: 8.7 mg/dL — ABNORMAL LOW (ref 8.9–10.3)
Chloride: 96 mmol/L — ABNORMAL LOW (ref 101–111)
Creatinine, Ser: 1.84 mg/dL — ABNORMAL HIGH (ref 0.61–1.24)
GFR calc Af Amer: 52 mL/min — ABNORMAL LOW (ref >60)
GFR calc non Af Amer: 45 mL/min — ABNORMAL LOW (ref >60)
Glucose, Bld: 366 mg/dL — ABNORMAL HIGH (ref 65–99)
Potassium: 4 mmol/L (ref 3.5–5.1)
Sodium: 131 mmol/L — ABNORMAL LOW (ref 135–145)

## 2016-11-29 LAB — CBG MONITORING, ED
GLUCOSE-CAPILLARY: 360 mg/dL — AB (ref 65–99)
Glucose-Capillary: 369 mg/dL — ABNORMAL HIGH (ref 65–99)

## 2016-11-29 LAB — I-STAT CG4 LACTIC ACID, ED: LACTIC ACID, VENOUS: 3.08 mmol/L — AB (ref 0.5–1.9)

## 2016-11-29 MED ORDER — SODIUM CHLORIDE 0.9 % IV BOLUS (SEPSIS)
1000.0000 mL | Freq: Once | INTRAVENOUS | Status: AC
  Administered 2016-11-29: 1000 mL via INTRAVENOUS

## 2016-11-29 MED ORDER — SODIUM CHLORIDE 0.9 % IV BOLUS (SEPSIS)
500.0000 mL | Freq: Once | INTRAVENOUS | Status: AC
  Administered 2016-11-30: 500 mL via INTRAVENOUS

## 2016-11-29 MED ORDER — CEFEPIME HCL 2 G IJ SOLR
INTRAMUSCULAR | Status: AC
  Administered 2016-11-30
  Filled 2016-11-29: qty 2

## 2016-11-29 MED ORDER — DEXTROSE 5 % IV SOLN
2.0000 g | Freq: Once | INTRAVENOUS | Status: AC
  Administered 2016-11-29: 2 g via INTRAVENOUS

## 2016-11-29 MED ORDER — SODIUM CHLORIDE 0.9 % IV BOLUS (SEPSIS)
1000.0000 mL | Freq: Once | INTRAVENOUS | Status: AC
  Administered 2016-11-30: 1000 mL via INTRAVENOUS

## 2016-11-29 MED ORDER — LIDOCAINE HCL (PF) 1 % IJ SOLN
5.0000 mL | Freq: Once | INTRAMUSCULAR | Status: AC
  Administered 2016-11-30: 5 mL via INTRADERMAL

## 2016-11-29 MED ORDER — LEVOFLOXACIN IN D5W 750 MG/150ML IV SOLN
750.0000 mg | Freq: Once | INTRAVENOUS | Status: DC
  Filled 2016-11-29: qty 150

## 2016-11-29 MED ORDER — DEXTROSE 5 % IV SOLN
2.0000 g | Freq: Once | INTRAVENOUS | Status: DC
  Filled 2016-11-29: qty 2

## 2016-11-29 MED ORDER — ACETAMINOPHEN 325 MG PO TABS
650.0000 mg | ORAL_TABLET | Freq: Once | ORAL | Status: AC
  Administered 2016-11-29: 650 mg via ORAL
  Filled 2016-11-29: qty 2

## 2016-11-29 MED ORDER — INSULIN ASPART 100 UNIT/ML ~~LOC~~ SOLN
5.0000 [IU] | Freq: Once | SUBCUTANEOUS | Status: AC
  Administered 2016-11-29: 5 [IU] via SUBCUTANEOUS
  Filled 2016-11-29: qty 1

## 2016-11-29 MED ORDER — VANCOMYCIN HCL IN DEXTROSE 1-5 GM/200ML-% IV SOLN
1000.0000 mg | Freq: Once | INTRAVENOUS | Status: AC
  Administered 2016-11-29: 1000 mg via INTRAVENOUS
  Filled 2016-11-29: qty 200

## 2016-11-29 MED ORDER — OSELTAMIVIR PHOSPHATE 75 MG PO CAPS: 75.0000 mg | ORAL_CAPSULE | Freq: Two times a day (BID) | ORAL | 0 refills | Status: DC

## 2016-11-29 NOTE — ED Triage Notes (Signed)
[facility] and diagnosed with positive flu test. He forgot to tell the doctor he has an ulcer on the bottom of his left foot and he is diabetic.

## 2016-11-29 NOTE — ED Triage Notes (Signed)
Pt arrives via [facility] with reports of hyperglycemia and fever for the last few days. Pt diaphoretic on arrival. Pt took dayquil this morning and ibuprofen at 1300 today for fever. Pt became dizzy/weak around 1600. PT went to onsite clinic at his work and they found pt to be febrile at 103.1 and hyperglycemic at 417. BP 120/62, HR 110, RR 16, 100% on RA.

## 2016-11-29 NOTE — ED Provider Notes (Signed)
TIME SEEN: 11:06 PM  CHIEF COMPLAINT: Fever, left foot abscess  HPI: Pt is a 41 y.o. male with history of non-insulin-dependent diabetes, hypertension, asthma who presents to the emergency department with fever.  Has had fever for the past couple of days and dry cough.  Was seen at the [facility] emergency department earlier today and was told he could have the flu.  Influenza test sent and was pending.  Started on Tamiflu which he has not yet begun.  States when he got home he noticed an abscess, swelling and tenderness to a callus of the left medial foot.  No injury to this area.  No drainage.  Does have surrounding erythema and warmth.  He denies headache, neck pain or neck stiffness.  No sore throat.  No body aches.  No abdominal pain.  Did have one episode of vomiting earlier today.  No diarrhea.  No rash.  No sick contacts, recent travel, tick bite.  He has had an influenza vaccination this year.  ROS: See HPI Constitutional:  fever  Eyes: no drainage  ENT: no runny nose   Cardiovascular:  no chest pain  Resp: no SOB  GI: no vomiting GU: no dysuria Integumentary: no rash  Allergy: no hives  Musculoskeletal: no leg swelling  Neurological: no slurred speech ROS otherwise negative  PAST MEDICAL HISTORY/PAST SURGICAL HISTORY:  Past Medical History:  Diagnosis Date  . Allergy   . Asthma   . Diabetes mellitus   . Hypertension   . Noncompliance     MEDICATIONS:  Prior to Admission medications   Medication Sig Start Date End Date Taking? Authorizing Provider  lisinopril (ZESTRIL) 10 MG tablet Take 1 tablet (10 mg total) by mouth daily. Patient not taking: Reported on 11/29/2016 10/04/15   Berton Mountgbata, Sylvester I, MD  metFORMIN (GLUCOPHAGE) 1000 MG tablet Take 1 tablet (1,000 mg total) by mouth 2 (two) times daily with a meal. 01/26/16   English, Judeth CornfieldStephanie D, PA  metoCLOPramide (REGLAN) 5 MG tablet Take 1 tablet (5 mg total) by mouth every 6 (six) hours as needed for nausea or  vomiting. Patient not taking: Reported on 11/29/2016 10/04/15   Barnetta Chapelgbata, Sylvester I, MD  naphazoline-pheniramine (NAPHCON-A) 0.025-0.3 % ophthalmic solution Place 1 drop into the left eye every 4 (four) hours as needed for irritation. Patient not taking: Reported on 11/29/2016 06/17/16   Barrett HenleNadeau, Nicole Elizabeth, PA-C  ondansetron (ZOFRAN-ODT) 8 MG disintegrating tablet Take 1 tablet (8 mg total) by mouth every 8 (eight) hours as needed for nausea. Patient not taking: Reported on 11/29/2016 01/26/16   Trena PlattEnglish, Stephanie D, PA  oseltamivir (TAMIFLU) 75 MG capsule Take 1 capsule (75 mg total) by mouth every 12 (twelve) hours. 11/29/16   Hedges, Tinnie GensJeffrey, PA-C  SUMAtriptan (IMITREX) 50 MG tablet Take 1 tablet (50 mg total) by mouth every 2 (two) hours as needed for migraine or headache (Maximum 200mg  in one day). May repeat in 2 hours if headache persists or recurs. Patient not taking: Reported on 11/29/2016 10/04/15   Barnetta Chapelgbata, Sylvester I, MD    ALLERGIES:  Allergies  Allergen Reactions  . Bee Venom Anaphylaxis  . Penicillins Anaphylaxis and Shortness Of Breath    Has patient had a PCN reaction causing immediate rash, facial/tongue/throat swelling, SOB or lightheadedness with hypotension:YES Has patient had a PCN reaction causing severe rash involving mucus membranes or skin necrosis:  NO Has patient had a PCN reaction that required hospitalization YES Has patient had a PCN reaction occurring within the last  10 years: NO If all of the above answers are "NO", then may proceed with Cephalosporin use.     SOCIAL HISTORY:  Social History  Substance Use Topics  . Smoking status: Never Smoker  . Smokeless tobacco: Never Used  . Alcohol use No    FAMILY HISTORY: Family History  Problem Relation Age of Onset  . Diabetes Mother   . Hypertension Mother   . Kidney disease Father   . Hypertension Father   . Diabetes Maternal Grandmother   . Hypertension Maternal Grandmother   . Stroke Maternal  Grandmother   . Kidney disease Maternal Grandmother   . Depression Brother     EXAM: BP 107/67   Pulse (!) 117   Temp (!) 103.2 F (39.6 C) (Oral)   Resp 20   Ht 6' (1.829 m)   Wt 113.4 kg (250 lb)   SpO2 97%   BMI 33.91 kg/m  CONSTITUTIONAL: Alert and oriented and responds appropriately to questions. Well-appearing; well-nourished, febrile, nontoxic HEAD: Normocephalic EYES: Conjunctivae clear, pupils appear equal, EOMI ENT: normal nose; moist mucous membranes; No pharyngeal erythema or petechiae, no tonsillar hypertrophy or exudate, no uvular deviation, no unilateral swelling, no trismus or drooling, no muffled voice, normal phonation, no stridor, no dental caries present, no drainable dental abscess noted, no Ludwig's angina, tongue sits flat in the bottom of the mouth, no angioedema, no facial erythema or warmth, no facial swelling; no pain with movement of the neck. NECK: Supple, no meningismus, no nuchal rigidity, no LAD  CARD: Regular and tachycardic; S1 and S2 appreciated; no murmurs, no clicks, no rubs, no gallops RESP: Normal chest excursion without splinting or tachypnea; breath sounds clear and equal bilaterally; no wheezes, no rhonchi, no rales, no hypoxia or respiratory distress, speaking full sentences ABD/GI: Normal bowel sounds; non-distended; soft, non-tender, no rebound, no guarding, no peritoneal signs, no hepatosplenomegaly BACK:  The back appears normal and is non-tender to palpation, there is no CVA tenderness EXT: Normal ROM in all joints; non-tender to palpation; no edema; normal capillary refill; no cyanosis, no calf tenderness or swelling    SKIN: Normal color for age and race; warm; no rash; he has a fluctuant area to the medial aspect of the foot just next to the left great toe with surrounding erythema and warmth with no drainage NEURO: Moves all extremities equally PSYCH: The patient's mood and manner are appropriate. Grooming and personal hygiene are  appropriate.  MEDICAL DECISION MAKING: Patient here with fever.  I suspect that his source is actually secondary to a abscess to the left foot with surrounding cellulitis.  Labs earlier today showed leukocytosis of 20,000 with left shift, mild acute kidney injury with creatinine of 1.84 and hyperglycemia without DKA.  He was given IV fluids and insulin.  Discharge home with Tamiflu.  States that he found this swelling and tenderness when he got home today and decided to return to the emergency department.  We will repeat labs.  I am concerned for possible bacterial infection and sepsis.  We will give broad-spectrum antibiotics.  He may need admission.  ED PROGRESS: Labs show leukocytosis of 16,000 with left shift.  Creatinine 1.79.  Lactate 3.08.  Chest x-ray earlier today was clear.  He is receiving IV fluids, vancomycin and cefepime.   I have performed incision and drainage of the abscess to patient's left foot.  There is a large amount of foul-smelling purulent drainage.  No soft tissue crepitus appreciated.  Will obtain x-ray to see  if there is any sign of bony destruction.  Will discuss with hospitalist for admission.  He reports his primary care physician is at [facility].   1:13 AM Discussed patient's case with hospitalist, Dr. Katrinka Blazing.  I have recommended admission and patient (and family if present) agree with this plan. Admitting physician will place admission orders.   I reviewed all nursing notes, vitals, pertinent previous records, EKGs, lab and urine results, imaging (as available).     EKG Interpretation  Date/Time:  Monday November 29 2016 23:32:24 EDT Ventricular Rate:  109 PR Interval:    QRS Duration: 81 QT Interval:  325 QTC Calculation: 438 R Axis:   77 Text Interpretation:  Sinus tachycardia No significant change since last tracing other than rate is faster Confirmed by Ward, Baxter Hire [phone]) on 11/29/2016 11:44:53 PM       INCISION AND DRAINAGE Performed by: Raelyn Number Consent: Verbal consent obtained. Risks and benefits: risks, benefits and alternatives were discussed Type: abscess  Body area: Left medial foot  Anesthesia: local infiltration  Incision was made with a scalpel.  Local anesthetic: lidocaine 1 % without epinephrine  Anesthetic total: 3 ml  Complexity: complex Blunt dissection to break up loculations  Drainage: purulent  Drainage amount: Large  Packing material: None  Patient tolerance: Patient tolerated the procedure well with no immediate complications.      CRITICAL CARE Performed by: Raelyn Number   Total critical care time: 55 minutes  Critical care time was exclusive of separately billable procedures and treating other patients.  Critical care was necessary to treat or prevent imminent or life-threatening deterioration.  Critical care was time spent personally by me on the following activities: development of treatment plan with patient and/or surrogate as well as nursing, discussions with consultants, evaluation of patient's response to treatment, examination of patient, obtaining history from patient or surrogate, ordering and performing treatments and interventions, ordering and review of laboratory studies, ordering and review of radiographic studies, pulse oximetry and re-evaluation of patient's condition.     Ward, Layla Maw, DO 11/30/16 [phone]

## 2016-11-29 NOTE — Discharge Instructions (Signed)
Please read attached information. If you experience any new or worsening signs or symptoms please return to the emergency room for evaluation. Please follow-up with your primary care provider or specialist as discussed. Please use medication prescribed only as directed and discontinue taking if you have any concerning signs or symptoms.   °

## 2016-11-29 NOTE — ED Provider Notes (Signed)
[facility] EMERGENCY DEPARTMENT Provider Note   CSN: [id number] Arrival date & time: 11/29/16  1653     History   Chief Complaint Chief Complaint  Patient presents with  . Fever    HPI Timothy Heath is a 41 y.o. male.  HPI   41 year old male presents today with complaints of fever.  Patient notes that 2 days ago he had a dry nonproductive cough and was feeling hot while at work.  Patient notes symptoms continue to persist, attempted going into work today but was feeling unwell.  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  He notes he felt like he was going to pass out in the afternoon, was taken back and and noted to have a temperature of 103.6.  Patient was given 100 mg of Tylenol at the time of transfer to the emergency room.  Patient notes the cough has remained nonproductive, reports feverish, denies any significant shortness of breath, abdominal pain, rash, sinus pressure pain, neck stiffness, or any other significant signs or symptoms.  Patient's been tolerating p.o. with no changes in urine or bowel characteristics.  Patient notes he did have the influenza vaccine.        Past Medical History:  Diagnosis Date  . Allergy   . Asthma   . Diabetes mellitus   . Hypertension   . Noncompliance     Patient Active Problem List   Diagnosis Date Noted  . Nausea and vomiting 10/03/2015  . DM 10/31/2006  . HYPERTENSION 10/31/2006  . ASTHMA 10/31/2006    Past Surgical History:  Procedure Laterality Date  . APPENDECTOMY    . FRACTURE SURGERY    . HERNIA REPAIR    . KNEE SURGERY         Home Medications    Prior to Admission medications   Medication Sig Start Date End Date Taking? Authorizing Provider  metFORMIN (GLUCOPHAGE) 1000 MG tablet Take 1 tablet (1,000 mg total) by mouth 2 (two) times daily with a meal. 01/26/16  Yes English, Stephanie D, PA  lisinopril (ZESTRIL)  10 MG tablet Take 1 tablet (10 mg total) by mouth daily. Patient not taking: Reported on 11/29/2016 10/04/15   Berton Mount I, MD  metoCLOPramide (REGLAN) 5 MG tablet Take 1 tablet (5 mg total) by mouth every 6 (six) hours as needed for nausea or vomiting. Patient not taking: Reported on 11/29/2016 10/04/15   Barnetta Chapel, MD  naphazoline-pheniramine (NAPHCON-A) 0.025-0.3 % ophthalmic solution Place 1 drop into the left eye every 4 (four) hours as needed for irritation. Patient not taking: Reported on 11/29/2016 06/17/16   Barrett Henle, PA-C  ondansetron (ZOFRAN-ODT) 8 MG disintegrating tablet Take 1 tablet (8 mg total) by mouth every 8 (eight) hours as needed for nausea. Patient not taking: Reported on 11/29/2016 01/26/16   Trena Platt D, PA  oseltamivir (TAMIFLU) 75 MG capsule Take 1 capsule (75 mg total) by mouth every 12 (twelve) hours. 11/29/16   Boykin Baetz, Tinnie Gens, PA-C  SUMAtriptan (IMITREX) 50 MG tablet Take 1 tablet (50 mg total) by mouth every 2 (two) hours as needed for migraine or headache (Maximum 200mg  in one day). May repeat in 2 hours if headache persists or recurs. Patient not taking: Reported on 11/29/2016 10/04/15   Barnetta Chapel, MD    Family History Family History  Problem Relation Age of Onset  . Diabetes Mother   .  Hypertension Mother   . Kidney disease Father   . Hypertension Father   . Diabetes Maternal Grandmother   . Hypertension Maternal Grandmother   . Stroke Maternal Grandmother   . Kidney disease Maternal Grandmother   . Depression Brother     Social History Social History  Substance Use Topics  . Smoking status: Never Smoker  . Smokeless tobacco: Never Used  . Alcohol use No     Allergies   Bee venom and Penicillins   Review of Systems Review of Systems  All other systems reviewed and are negative.    Physical Exam Updated Vital Signs BP 123/79   Pulse 95   Temp 99 F (37.2 C) (Oral)   Resp (!) 24    SpO2 96%   Physical Exam  Constitutional: He is oriented to person, place, and time. He appears well-developed and well-nourished.  HENT:  Head: Normocephalic and atraumatic.  Mouth/Throat: Oropharynx is clear and moist and mucous membranes are normal. No oropharyngeal exudate, posterior oropharyngeal edema or tonsillar abscesses.  Eyes: Pupils are equal, round, and reactive to light. Conjunctivae are normal. Right eye exhibits no discharge. Left eye exhibits no discharge. No scleral icterus.  Neck: Normal range of motion. No JVD present. No tracheal deviation present.  Pulmonary/Chest: Effort normal and breath sounds normal. No stridor. No respiratory distress. He has no wheezes. He has no rales. He exhibits no tenderness.  Neurological: He is alert and oriented to person, place, and time. Coordination normal.  Psychiatric: He has a normal mood and affect. His behavior is normal. Judgment and thought content normal.  Nursing note and vitals reviewed.    ED Treatments / Results  Labs (all labs ordered are listed, but only abnormal results are displayed) Labs Reviewed  CBC WITH DIFFERENTIAL/PLATELET - Abnormal; Notable for the following:       Result Value   WBC 20.2 (*)    HCT 38.9 (*)    MCV 76.3 (*)    MCH 25.5 (*)    Neutro Abs 17.0 (*)    Monocytes Absolute 2.2 (*)    All other components within normal limits  BASIC METABOLIC PANEL - Abnormal; Notable for the following:    Sodium 131 (*)    Chloride 96 (*)    Glucose, Bld 366 (*)    Creatinine, Ser 1.84 (*)    Calcium 8.7 (*)    GFR calc non Af Amer 45 (*)    GFR calc Af Amer 52 (*)    All other components within normal limits  CBG MONITORING, ED - Abnormal; Notable for the following:    Glucose-Capillary 360 (*)    All other components within normal limits  CBG MONITORING, ED - Abnormal; Notable for the following:    Glucose-Capillary 369 (*)    All other components within normal limits  INFLUENZA PANEL BY PCR (TYPE A  & B)    EKG  EKG Interpretation None       Radiology Dg Chest 2 View  Result Date: 11/29/2016 CLINICAL DATA:  Fever cough. EXAM: CHEST  2 VIEW COMPARISON:  10/03/2015 FINDINGS: AP and lateral views of the chest obtained. The lungs are clear without focal pneumonia, edema, pneumothorax or pleural effusion. The cardiopericardial silhouette is within normal limits for size. The visualized bony structures of the thorax are intact. Telemetry leads overlie the chest. IMPRESSION: No active cardiopulmonary disease. Electronically Signed   By: Kennith Center M.D.   On: 11/29/2016 18:50    Procedures Procedures (  including critical care time)  Medications Ordered in ED Medications  insulin aspart (novoLOG) injection 5 Units (not administered)  sodium chloride 0.9 % bolus 1,000 mL (0 mLs Intravenous Stopped 11/29/16 1921)     Initial Impression / Assessment and Plan / ED Course  I have reviewed the triage vital signs and the nursing notes.  Pertinent labs & imaging results that were available during my care of the patient were reviewed by me and considered in my medical decision making (see chart for details).      Final Clinical Impressions(s) / ED Diagnoses   Final diagnoses:  Flu-like symptoms  Hyperglycemia    Labs: CBC, BMP, CBG-360  Imaging:  Consults:  Therapeutics: Insulin regular  Discharge Meds: Tamiflu  Assessment/Plan: 41 year old presents today with flulike symptoms.  Patient has clear lung sounds, his fever resolved with Tylenol.  He has stable vital signs.  Patient was noted to be hyperglycemic, no signs of DKA.  Patient given a liter of fluid, given 5 units of subcu insulin.  Patient will be discharged with instructions to continue using metformin at home, avoid sugary drinks, drink plenty of fluids, rest, return immediately with any new or worsening signs or symptoms.  Influenza results will not be available immediately, will prophylactically treat given  history of diabetes and presentation.     New Prescriptions New Prescriptions   OSELTAMIVIR (TAMIFLU) 75 MG CAPSULE    Take 1 capsule (75 mg total) by mouth every 12 (twelve) hours.     Timothy Heath, Timothy Lave, PA-C 11/29/16 2018    Rolland PorterJames, Mark, MD 12/17/16 [phone]

## 2016-11-30 ENCOUNTER — Emergency Department (HOSPITAL_BASED_OUTPATIENT_CLINIC_OR_DEPARTMENT_OTHER): Payer: 59

## 2016-11-30 DIAGNOSIS — L0291 Cutaneous abscess, unspecified: Secondary | ICD-10-CM | POA: Diagnosis not present

## 2016-11-30 DIAGNOSIS — J069 Acute upper respiratory infection, unspecified: Secondary | ICD-10-CM | POA: Diagnosis not present

## 2016-11-30 DIAGNOSIS — L02612 Cutaneous abscess of left foot: Secondary | ICD-10-CM | POA: Insufficient documentation

## 2016-11-30 DIAGNOSIS — L03116 Cellulitis of left lower limb: Secondary | ICD-10-CM | POA: Diagnosis present

## 2016-11-30 DIAGNOSIS — J189 Pneumonia, unspecified organism: Secondary | ICD-10-CM | POA: Diagnosis not present

## 2016-11-30 DIAGNOSIS — Z79899 Other long term (current) drug therapy: Secondary | ICD-10-CM | POA: Diagnosis not present

## 2016-11-30 DIAGNOSIS — E1165 Type 2 diabetes mellitus with hyperglycemia: Secondary | ICD-10-CM | POA: Diagnosis present

## 2016-11-30 DIAGNOSIS — Z794 Long term (current) use of insulin: Secondary | ICD-10-CM | POA: Diagnosis not present

## 2016-11-30 DIAGNOSIS — L97509 Non-pressure chronic ulcer of other part of unspecified foot with unspecified severity: Secondary | ICD-10-CM | POA: Diagnosis not present

## 2016-11-30 DIAGNOSIS — Z6833 Body mass index (BMI) 33.0-33.9, adult: Secondary | ICD-10-CM | POA: Diagnosis not present

## 2016-11-30 DIAGNOSIS — Z7984 Long term (current) use of oral hypoglycemic drugs: Secondary | ICD-10-CM | POA: Diagnosis not present

## 2016-11-30 DIAGNOSIS — J45909 Unspecified asthma, uncomplicated: Secondary | ICD-10-CM | POA: Diagnosis present

## 2016-11-30 DIAGNOSIS — L02619 Cutaneous abscess of unspecified foot: Secondary | ICD-10-CM | POA: Diagnosis not present

## 2016-11-30 DIAGNOSIS — Z880 Allergy status to penicillin: Secondary | ICD-10-CM | POA: Diagnosis not present

## 2016-11-30 DIAGNOSIS — L97528 Non-pressure chronic ulcer of other part of left foot with other specified severity: Secondary | ICD-10-CM | POA: Diagnosis not present

## 2016-11-30 DIAGNOSIS — L03119 Cellulitis of unspecified part of limb: Secondary | ICD-10-CM | POA: Diagnosis not present

## 2016-11-30 DIAGNOSIS — N183 Chronic kidney disease, stage 3 (moderate): Secondary | ICD-10-CM | POA: Diagnosis present

## 2016-11-30 DIAGNOSIS — R05 Cough: Secondary | ICD-10-CM | POA: Diagnosis present

## 2016-11-30 DIAGNOSIS — Z91030 Bee allergy status: Secondary | ICD-10-CM | POA: Diagnosis not present

## 2016-11-30 DIAGNOSIS — E1122 Type 2 diabetes mellitus with diabetic chronic kidney disease: Secondary | ICD-10-CM | POA: Diagnosis present

## 2016-11-30 DIAGNOSIS — I129 Hypertensive chronic kidney disease with stage 1 through stage 4 chronic kidney disease, or unspecified chronic kidney disease: Secondary | ICD-10-CM | POA: Diagnosis present

## 2016-11-30 DIAGNOSIS — L97529 Non-pressure chronic ulcer of other part of left foot with unspecified severity: Secondary | ICD-10-CM | POA: Diagnosis present

## 2016-11-30 DIAGNOSIS — Z833 Family history of diabetes mellitus: Secondary | ICD-10-CM | POA: Diagnosis not present

## 2016-11-30 DIAGNOSIS — N179 Acute kidney failure, unspecified: Secondary | ICD-10-CM | POA: Diagnosis present

## 2016-11-30 DIAGNOSIS — E11621 Type 2 diabetes mellitus with foot ulcer: Secondary | ICD-10-CM | POA: Diagnosis present

## 2016-11-30 LAB — COMPREHENSIVE METABOLIC PANEL
ALK PHOS: 82 U/L (ref 38–126)
ALT: 13 U/L — ABNORMAL LOW (ref 17–63)
ANION GAP: 10 (ref 5–15)
AST: 25 U/L (ref 15–41)
Albumin: 3.3 g/dL — ABNORMAL LOW (ref 3.5–5.0)
BILIRUBIN TOTAL: 1.3 mg/dL — AB (ref 0.3–1.2)
BUN: 17 mg/dL (ref 6–20)
CALCIUM: 8.3 mg/dL — AB (ref 8.9–10.3)
CO2: 23 mmol/L (ref 22–32)
Chloride: 98 mmol/L — ABNORMAL LOW (ref 101–111)
Creatinine, Ser: 1.79 mg/dL — ABNORMAL HIGH (ref 0.61–1.24)
GFR, EST AFRICAN AMERICAN: 53 mL/min — AB (ref >60)
GFR, EST NON AFRICAN AMERICAN: 46 mL/min — AB (ref >60)
GLUCOSE: 333 mg/dL — AB (ref 65–99)
Potassium: 4 mmol/L (ref 3.5–5.1)
Sodium: 131 mmol/L — ABNORMAL LOW (ref 135–145)
TOTAL PROTEIN: 6.9 g/dL (ref 6.5–8.1)

## 2016-11-30 LAB — URINALYSIS, ROUTINE W REFLEX MICROSCOPIC
Bilirubin Urine: NEGATIVE
KETONES UR: NEGATIVE mg/dL
LEUKOCYTES UA: NEGATIVE
NITRITE: NEGATIVE
PROTEIN: 30 mg/dL — AB
Specific Gravity, Urine: 1.02 (ref 1.005–1.030)
pH: 5.5 (ref 5.0–8.0)

## 2016-11-30 LAB — URINALYSIS, MICROSCOPIC (REFLEX): WBC UA: NONE SEEN WBC/hpf (ref 0–5)

## 2016-11-30 LAB — GLUCOSE, CAPILLARY
GLUCOSE-CAPILLARY: 147 mg/dL — AB (ref 65–99)
GLUCOSE-CAPILLARY: 142 mg/dL — AB (ref 65–99)
Glucose-Capillary: 233 mg/dL — ABNORMAL HIGH (ref 65–99)
Glucose-Capillary: 204 mg/dL — ABNORMAL HIGH (ref 65–99)

## 2016-11-30 LAB — I-STAT CG4 LACTIC ACID, ED: Lactic Acid, Venous: 1.42 mmol/L (ref 0.5–1.9)

## 2016-11-30 LAB — LACTIC ACID, PLASMA: Lactic Acid, Venous: 1.1 mmol/L (ref 0.5–1.9)

## 2016-11-30 LAB — MRSA PCR SCREENING: MRSA BY PCR: NEGATIVE

## 2016-11-30 MED ORDER — ZOLPIDEM TARTRATE 5 MG PO TABS: 5.0000 mg | ORAL_TABLET | Freq: Every evening | ORAL | Status: DC | PRN

## 2016-11-30 MED ORDER — ACETAMINOPHEN 650 MG RE SUPP: 650.0000 mg | Freq: Four times a day (QID) | RECTAL | Status: DC | PRN

## 2016-11-30 MED ORDER — INSULIN ASPART 100 UNIT/ML ~~LOC~~ SOLN: 0.0000 [IU] | SUBCUTANEOUS | Status: DC

## 2016-11-30 MED ORDER — ONDANSETRON HCL 4 MG/2ML IJ SOLN
4.0000 mg | Freq: Four times a day (QID) | INTRAMUSCULAR | Status: DC | PRN
  Administered 2016-12-02: 4 mg via INTRAVENOUS
  Filled 2016-11-30: qty 2

## 2016-11-30 MED ORDER — VANCOMYCIN HCL IN DEXTROSE 1-5 GM/200ML-% IV SOLN
1000.0000 mg | Freq: Two times a day (BID) | INTRAVENOUS | Status: DC
  Administered 2016-11-30 – 2016-12-01 (×3): 1000 mg via INTRAVENOUS
  Filled 2016-11-30 (×2): qty 200

## 2016-11-30 MED ORDER — HYDROCODONE-ACETAMINOPHEN 5-325 MG PO TABS
1.0000 | ORAL_TABLET | ORAL | Status: DC | PRN
  Administered 2016-11-30 – 2016-12-02 (×5): 2 via ORAL
  Filled 2016-11-30 (×6): qty 2

## 2016-11-30 MED ORDER — ENOXAPARIN SODIUM 60 MG/0.6ML ~~LOC~~ SOLN
0.5000 mg/kg | SUBCUTANEOUS | Status: DC
  Administered 2016-12-01 – 2016-12-02 (×2): 55 mg via SUBCUTANEOUS
  Filled 2016-11-30 (×2): qty 0.6

## 2016-11-30 MED ORDER — NAPHAZOLINE-PHENIRAMINE 0.025-0.3 % OP SOLN
1.0000 [drp] | OPHTHALMIC | Status: DC | PRN
  Filled 2016-11-30: qty 15

## 2016-11-30 MED ORDER — DEXTROSE 5 % IV SOLN
2.0000 g | Freq: Two times a day (BID) | INTRAVENOUS | Status: DC
  Administered 2016-11-30 (×2): 2 g via INTRAVENOUS
  Filled 2016-11-30 (×3): qty 2

## 2016-11-30 MED ORDER — ONDANSETRON HCL 4 MG PO TABS
4.0000 mg | ORAL_TABLET | Freq: Four times a day (QID) | ORAL | Status: DC | PRN
  Administered 2016-12-02: 4 mg via ORAL
  Filled 2016-11-30: qty 1

## 2016-11-30 MED ORDER — OSELTAMIVIR PHOSPHATE 75 MG PO CAPS
75.0000 mg | ORAL_CAPSULE | Freq: Two times a day (BID) | ORAL | Status: DC
  Administered 2016-11-30 (×2): 75 mg via ORAL
  Filled 2016-11-30 (×4): qty 1

## 2016-11-30 MED ORDER — ACETAMINOPHEN 325 MG PO TABS
650.0000 mg | ORAL_TABLET | Freq: Four times a day (QID) | ORAL | Status: DC | PRN
  Administered 2016-11-30 – 2016-12-01 (×2): 650 mg via ORAL
  Filled 2016-11-30 (×2): qty 2

## 2016-11-30 MED ORDER — INSULIN ASPART 100 UNIT/ML ~~LOC~~ SOLN
0.0000 [IU] | Freq: Three times a day (TID) | SUBCUTANEOUS | Status: DC
  Administered 2016-11-30 (×2): 2 [IU] via SUBCUTANEOUS
  Administered 2016-11-30 (×2): 5 [IU] via SUBCUTANEOUS
  Administered 2016-12-01: 2 [IU] via SUBCUTANEOUS

## 2016-11-30 MED ORDER — LIDOCAINE HCL 1 % IJ SOLN
INTRAMUSCULAR | Status: AC
  Administered 2016-11-30: 01:00:00
  Filled 2016-11-30: qty 20

## 2016-11-30 MED ORDER — ENOXAPARIN SODIUM 30 MG/0.3ML ~~LOC~~ SOLN
30.0000 mg | SUBCUTANEOUS | Status: DC
  Administered 2016-11-30: 30 mg via SUBCUTANEOUS
  Filled 2016-11-30: qty 0.3

## 2016-11-30 MED ORDER — DOCUSATE SODIUM 100 MG PO CAPS
100.0000 mg | ORAL_CAPSULE | Freq: Two times a day (BID) | ORAL | Status: DC
  Administered 2016-12-01 (×2): 100 mg via ORAL
  Filled 2016-11-30 (×6): qty 1

## 2016-11-30 MED ORDER — SODIUM CHLORIDE 0.9 % IV SOLN
INTRAVENOUS | Status: DC
  Administered 2016-11-30 – 2016-12-03 (×5): via INTRAVENOUS

## 2016-11-30 NOTE — H&P (Signed)
[facility]                                                                                    Patient Demographics  Timothy Heath, is a 41 y.o. male  CSN: [id number]  MRN: [id number]  DOB - [birth year]  Admit Date - 11/29/2016  Outpatient Primary MD for the patient is [name], MD   With History of -  Past Medical History:  Diagnosis Date  . Allergy   . Asthma   . Diabetes mellitus   . Hypertension   . Noncompliance       Past Surgical History:  Procedure Laterality Date  . APPENDECTOMY    . FRACTURE SURGERY    . HERNIA REPAIR    . KNEE SURGERY      in for   Chief Complaint  Patient presents with  . Influenza  . Skin Ulcer     HPI  Timothy Heath  is a 41 y.o. male, with past medical history significant for diabetes mellitus and hypertension presenting today with fever and left foot also noted yesterday. Patient had fever with cough that started on Saturday and he was started empirically on Tamiflu yesterday and was sent home. However, he noticed a new ulcer on his left foot and came back again and was admitted for left foot ulcer and fever. Flu PCR was ordered and is pending . Patient is started on Tamiflu and IV antibiotics and wound care nurse to evaluate patient.    Review of Systems    In addition to the HPI above,   No Headache, No changes with Vision or hearing, No problems swallowing food or Liquids, No Chest pain,  No Abdominal pain, No Nausea or Vommitting, Bowel movements are regular, No Blood in stool or Urine, No dysuria, No new skin rashes or bruises, No new joints pains-aches,  No new weakness, tingling, numbness in any extremity, No recent weight gain or loss, No polyuria, polydypsia or polyphagia, No significant Mental Stressors.  A full 10 point Review of Systems was done, except as stated above, all other Review of Systems were negative.   Social History Social History  Substance Use Topics  . Smoking  status: Never Smoker  . Smokeless tobacco: Never Used  . Alcohol use No     Family History Family History  Problem Relation Age of Onset  . Diabetes Mother   . Hypertension Mother   . Kidney disease Father   . Hypertension Father   . Diabetes Maternal Grandmother   . Hypertension Maternal Grandmother   . Stroke Maternal Grandmother   . Kidney disease Maternal Grandmother   . Depression Brother      Prior to Admission medications   Medication Sig Start Date End Date Taking? Authorizing Provider  lisinopril (ZESTRIL) 10 MG tablet Take 1 tablet (10 mg total) by mouth daily. Patient not taking: Reported on 11/29/2016 10/04/15   Berton Mount I, MD  metFORMIN (GLUCOPHAGE) 1000 MG tablet Take 1 tablet (1,000 mg total) by mouth 2 (two) times daily with a meal. 01/26/16   English, Judeth Cornfield D, PA  metoCLOPramide (REGLAN) 5 MG tablet Take 1 tablet (5 mg  total) by mouth every 6 (six) hours as needed for nausea or vomiting. Patient not taking: Reported on 11/29/2016 10/04/15   Barnetta Chapelgbata, Sylvester I, MD  naphazoline-pheniramine (NAPHCON-A) 0.025-0.3 % ophthalmic solution Place 1 drop into the left eye every 4 (four) hours as needed for irritation. Patient not taking: Reported on 11/29/2016 06/17/16   Barrett HenleNadeau, Nicole Elizabeth, PA-C  ondansetron (ZOFRAN-ODT) 8 MG disintegrating tablet Take 1 tablet (8 mg total) by mouth every 8 (eight) hours as needed for nausea. Patient not taking: Reported on 11/29/2016 01/26/16   Trena PlattEnglish, Stephanie D, PA  oseltamivir (TAMIFLU) 75 MG capsule Take 1 capsule (75 mg total) by mouth every 12 (twelve) hours. 11/29/16   Hedges, Tinnie GensJeffrey, PA-C  SUMAtriptan (IMITREX) 50 MG tablet Take 1 tablet (50 mg total) by mouth every 2 (two) hours as needed for migraine or headache (Maximum 200mg  in one day). May repeat in 2 hours if headache persists or recurs. Patient not taking: Reported on 11/29/2016 10/04/15   Barnetta Chapelgbata, Sylvester I, MD    Allergies  Allergen Reactions  . Bee  Venom Anaphylaxis  . Penicillins Anaphylaxis and Shortness Of Breath    Has patient had a PCN reaction causing immediate rash, facial/tongue/throat swelling, SOB or lightheadedness with hypotension:YES Has patient had a PCN reaction causing severe rash involving mucus membranes or skin necrosis:  NO Has patient had a PCN reaction that required hospitalization YES Has patient had a PCN reaction occurring within the last 10 years: NO If all of the above answers are "NO", then may proceed with Cephalosporin use.     Physical Exam  Vitals  Blood pressure 123/90, pulse 93, temperature 99.7 F (37.6 C), temperature source Oral, resp. rate 18, height 6' (1.829 m), weight 113.4 kg (250 lb), SpO2 100 %.   1. General Young male in no acute distress  2. Normal affect and insight, Not Suicidal or Homicidal, Awake Alert, Oriented X 3.  3. No F.N deficits, moving all extremities.  4. Ears and Eyes appear Normal, Conjunctivae clear, PERRLA. Moist Oral Mucosa.  5. Supple Neck, No JVD, No cervical lymphadenopathy appriciated, No Carotid Bruits.  6. Symmetrical Chest wall movement, Good air movement bilaterally, CTAB.  7. RRR, No Gallops, Rubs or Murmurs, No Parasternal Heave.  8. Positive Bowel Sounds, Abdomen Soft, Non tender, No organomegaly appriciated,No rebound -guarding or rigidity.  9.  No Cyanosis, Normal Skin Turgor, No Skin Rash or Bruise.  10. Good muscle tone, left foot ulcer noted at the plantar aspect, draining size of a quarter.    Data Review  CBC  Recent Labs Lab 11/29/16 1736 11/29/16 2330  WBC 20.2* 16.0*  HGB 13.0 12.8*  HCT 38.9* 37.5*  PLT 221 212  MCV 76.3* 75.8*  MCH 25.5* 25.9*  MCHC 33.4 34.1  RDW 13.4 13.4  LYMPHSABS 0.8 0.6*  MONOABS 2.2* 0.6  EOSABS 0.1 0.0  BASOSABS 0.0 0.0   ------------------------------------------------------------------------------------------------------------------  Chemistries   Recent Labs Lab 11/29/16 1736  11/29/16 2330  NA 131* 131*  K 4.0 4.0  CL 96* 98*  CO2 26 23  GLUCOSE 366* 333*  BUN 12 17  CREATININE 1.84* 1.79*  CALCIUM 8.7* 8.3*  AST  --  25  ALT  --  13*  ALKPHOS  --  82  BILITOT  --  1.3*   ------------------------------------------------------------------------------------------------------------------ estimated creatinine clearance is 72 mL/min (A) (by C-G formula based on SCr of 1.79 mg/dL (H)). ------------------------------------------------------------------------------------------------------------------ No results for input(s): TSH, T4TOTAL, T3FREE, THYROIDAB in the last 72 hours.  Invalid input(s): FREET3   Coagulation profile No results for input(s): INR, PROTIME in the last 168 hours. ------------------------------------------------------------------------------------------------------------------- No results for input(s): DDIMER in the last 72 hours. -------------------------------------------------------------------------------------------------------------------  Cardiac Enzymes No results for input(s): CKMB, TROPONINI, MYOGLOBIN in the last 168 hours.  Invalid input(s): CK ------------------------------------------------------------------------------------------------------------------ Invalid input(s): POCBNP   ---------------------------------------------------------------------------------------------------------------  Urinalysis    Component Value Date/Time   COLORURINE YELLOW 11/30/2016 0045   APPEARANCEUR CLEAR 11/30/2016 0045   LABSPEC 1.020 11/30/2016 0045   PHURINE 5.5 11/30/2016 0045   GLUCOSEU >=500 (A) 11/30/2016 0045   HGBUR MODERATE (A) 11/30/2016 0045   BILIRUBINUR NEGATIVE 11/30/2016 0045   BILIRUBINUR negative 01/26/2016 0912   KETONESUR NEGATIVE 11/30/2016 0045   PROTEINUR 30 (A) 11/30/2016 0045   UROBILINOGEN 0.2 01/26/2016 0912   UROBILINOGEN 0.2 09/20/2011 1938   NITRITE NEGATIVE 11/30/2016 0045   LEUKOCYTESUR  NEGATIVE 11/30/2016 0045    ----------------------------------------------------------------------------------------------------------------    Imaging results:   Dg Chest 2 View  Result Date: 11/29/2016 CLINICAL DATA:  Fever cough. EXAM: CHEST  2 VIEW COMPARISON:  10/03/2015 FINDINGS: AP and lateral views of the chest obtained. The lungs are clear without focal pneumonia, edema, pneumothorax or pleural effusion. The cardiopericardial silhouette is within normal limits for size. The visualized bony structures of the thorax are intact. Telemetry leads overlie the chest. IMPRESSION: No active cardiopulmonary disease. Electronically Signed   By: Kennith Center M.D.   On: 11/29/2016 18:50   Dg Foot Complete Left  Result Date: 11/30/2016 CLINICAL DATA:  Ulceration along the plantar aspect of the left foot, at the first metatarsophalangeal joint. Initial encounter. EXAM: LEFT FOOT - COMPLETE 3+ VIEW COMPARISON:  None. FINDINGS: There is no evidence of fracture or dislocation. No osseous erosions are seen. The joint spaces are preserved. There is no evidence of talar subluxation; the subtalar joint is unremarkable in appearance. A soft tissue ulceration is noted at the medial aspect of the first metatarsophalangeal joint. No radiopaque foreign bodies are seen. IMPRESSION: No evidence of fracture or dislocation.  No osseous erosions seen. Electronically Signed   By: Roanna Raider M.D.   On: 11/30/2016 01:21        Assessment & Plan   1. Left foot abscess/cellulitis, diabetic foot ulcer     Awaiting cultures 2. Diabetes mellitus 3. Upper respiratory infection? Influenza  Plan  Admit to regular floor Check flu panel IV vancomycin and cefepime Consult wound care nurse    DVT Prophylaxis Lovenox  AM Labs Ordered, also please review Full Orders    Code Status full  Disposition Plan: Home  Time spent in minutes : 42 minutes  Condition GUARDED   @SIGNATURE @

## 2016-11-30 NOTE — ED Notes (Signed)
Patient denies pain and is resting comfortably.  

## 2016-11-30 NOTE — Progress Notes (Signed)
Pharmacy Antibiotic Note  Timothy Heath is a 41 y.o. male admitted on 11/29/2016 with fever, cellulitis/abscess.  Pharmacy has been consulted for Vancomycin dosing. Was seen earlier on 10/22 for possible flu then returned to the ED later on 10/22 after noticing abscess on foot. WBC is elevated. Noted renal dysfunction with normalized CrCl of ~56 (~72 with adjusted body weight).   Plan: -Vancomycin 1000 mg IV q12h, increase dose if renal function improves  -F/U additional gram negative coverage if desired -Trend WBC, temp, renal function  -Drug levels as indicated   Height: 6' (182.9 cm) Weight: 250 lb (113.4 kg) IBW/kg (Calculated) : 77.6  Temp (24hrs), Avg:100.6 F (38.1 C), Min:99 F (37.2 C), Max:103.2 F (39.6 C)   Recent Labs Lab 11/29/16 1736 11/29/16 2330 11/29/16 2342 11/30/16 0149  WBC 20.2* 16.0*  --   --   CREATININE 1.84* 1.79*  --   --   LATICACIDVEN  --   --  3.08* 1.42    Estimated Creatinine Clearance: 72 mL/min (A) (by C-G formula based on SCr of 1.79 mg/dL (H)).    Allergies  Allergen Reactions  . Bee Venom Anaphylaxis  . Penicillins Anaphylaxis and Shortness Of Breath    Has patient had a PCN reaction causing immediate rash, facial/tongue/throat swelling, SOB or lightheadedness with hypotension:YES Has patient had a PCN reaction causing severe rash involving mucus membranes or skin necrosis:  NO Has patient had a PCN reaction that required hospitalization YES Has patient had a PCN reaction occurring within the last 10 years: NO If all of the above answers are "NO", then may proceed with Cephalosporin use.     Abran DukeLedford, Toure Edmonds 11/30/2016 2:25 AM

## 2016-11-30 NOTE — Progress Notes (Signed)
Temp 102. Dr. Sharyon MedicusHijazi notified via text page.

## 2016-11-30 NOTE — Consult Note (Addendum)
WOC Nurse wound consult note Reason for Consult:Left medial foot ulcer at hallux (great toe), first metatarsal head Wound type:Infectious Pressure Injury POA: NA Measurement: 5cm x 3cm area of erythema, maceration and warmth with 1cm linear incision in center ([facility]), through which moderate amount of serous exudate is draining Wound bed:no wound bed visible through incision Drainage (amount, consistency, odor) As described above Periwound: As described above Dressing procedure/placement/frequency: I have implemented a conservative POC using twice daily saline moist to dry dressing to the affected area with elevation. Recommend evaluation by orthopedics for wider debridement of area or for minimally, follow up in their offices and for consideration of orthotic in his steel-toe work boots.  IF you agree, please order/consult. WOC nursing team will not follow, but will remain available to this patient, the nursing and medical teams.  Please re-consult if needed. Thanks, Ladona MowLaurie Deisy Ozbun, MSN, RN, GNP, Hans EdenCWOCN, CWON-AP, FAAN  Pager# [phone]

## 2016-11-30 NOTE — Progress Notes (Signed)
Pharmacy - IV Cefepime  Assessment:    Please see note from Abran DukeJames Ledford, PharmD earlier today for full details.  Briefly, 41 y.o. male started on Vancomycin for purulent foot abscess. [facility], now to continue per Pharmacy with continued fevers.   Cefepime indication given as osteomyelitis, but this does not seem to be noted on XR and no H&P yet.  Plan:   Cefepime 2g IV q12 hr  Bernadene Personrew Amanie Mcculley, PharmD, BCPS Pager: [phone] 11/30/2016, 1:45 PM

## 2016-11-30 NOTE — Progress Notes (Signed)
Inpatient Diabetes Program Recommendations  AACE/ADA: New Consensus Statement on Inpatient Glycemic Control (2015)  Target Ranges:  Prepandial:   less than 140 mg/dL      Peak postprandial:   less than 180 mg/dL (1-2 hours)      Critically ill patients:  140 - 180 mg/dL   Results for Timothy Heath, [name] (MRN [id number]) as of 11/30/2016 10:30  Ref. Range 11/29/2016 17:03 11/29/2016 20:15 11/30/2016 07:41  Glucose-Capillary Latest Ref Range: 65 - 99 mg/dL 409360 (H) 811369 (H) 914233 (H)    Admit with: Fever/ Foot Abscess  History: DM  Home DM Meds: Metformin 1000 mg BID  Current Insulin Orders: Novolog Moderate Correction Scale/ SSI (0-15 units) TID AC + HS      MD- Please consider the following in-hospital insulin adjustments:  1. If fasting glucose remains elevated, please consider starting basal insulin: Lantus 15 units daily (~0.15 units/kg dosing)  2. Change PO diet to Carbohydrate Modified diet (currently ordered as Regular diet)  3. Please check current Hemoglobin A1c level- Last one on file was 14% on 01/26/2016      --Will follow patient during hospitalization--  Ambrose FinlandJeannine Johnston Jesenia Spera RN, MSN, CDE Diabetes Coordinator Inpatient Glycemic Control Team Team Pager: [phone] (8a-5p)

## 2016-11-30 NOTE — Plan of Care (Signed)
[facility] Dr. Elesa MassedWard Mr. Timothy Heath is a 41 y/o male with pmh of HTN, DM, asthma, obesity, and noncompliance; who presents with fever. Seen in ED earlier in day thought to have flu for which he was given Tamiflu, but never started. Once home noticed left lower foot abscess and swelling and came back to the ED. I&D in the emergency department with cultures sent. WBC 16 and lactic acid 3.08. Sepsis protocol initiated with fluid bolus. Placed on empiric antibiotics of vancomycin and cefepime. [facility]

## 2016-12-01 DIAGNOSIS — L97509 Non-pressure chronic ulcer of other part of unspecified foot with unspecified severity: Secondary | ICD-10-CM

## 2016-12-01 DIAGNOSIS — E1122 Type 2 diabetes mellitus with diabetic chronic kidney disease: Secondary | ICD-10-CM

## 2016-12-01 DIAGNOSIS — L03116 Cellulitis of left lower limb: Secondary | ICD-10-CM

## 2016-12-01 DIAGNOSIS — E11621 Type 2 diabetes mellitus with foot ulcer: Secondary | ICD-10-CM

## 2016-12-01 DIAGNOSIS — L03119 Cellulitis of unspecified part of limb: Secondary | ICD-10-CM

## 2016-12-01 DIAGNOSIS — N183 Chronic kidney disease, stage 3 (moderate): Secondary | ICD-10-CM

## 2016-12-01 DIAGNOSIS — L02619 Cutaneous abscess of unspecified foot: Secondary | ICD-10-CM

## 2016-12-01 LAB — CBC
HCT: 35.2 % — ABNORMAL LOW (ref 39.0–52.0)
HEMOGLOBIN: 11.9 g/dL — AB (ref 13.0–17.0)
MCH: 26 pg (ref 26.0–34.0)
MCHC: 33.8 g/dL (ref 30.0–36.0)
MCV: 76.9 fL — ABNORMAL LOW (ref 78.0–100.0)
PLATELETS: 189 10*3/uL (ref 150–400)
RBC: 4.58 MIL/uL (ref 4.22–5.81)
RDW: 13.7 % (ref 11.5–15.5)
WBC: 14.6 10*3/uL — ABNORMAL HIGH (ref 4.0–10.5)

## 2016-12-01 LAB — BASIC METABOLIC PANEL
ANION GAP: 9 (ref 5–15)
BUN: 10 mg/dL (ref 6–20)
CALCIUM: 7.9 mg/dL — AB (ref 8.9–10.3)
CO2: 22 mmol/L (ref 22–32)
Chloride: 106 mmol/L (ref 101–111)
Creatinine, Ser: 1.25 mg/dL — ABNORMAL HIGH (ref 0.61–1.24)
GLUCOSE: 131 mg/dL — AB (ref 65–99)
Potassium: 3.5 mmol/L (ref 3.5–5.1)
Sodium: 137 mmol/L (ref 135–145)

## 2016-12-01 LAB — HEMOGLOBIN A1C
HEMOGLOBIN A1C: 13.6 % — AB (ref 4.8–5.6)
MEAN PLASMA GLUCOSE: 343.62 mg/dL

## 2016-12-01 LAB — INFLUENZA PANEL BY PCR (TYPE A & B)
INFLAPCR: NEGATIVE
Influenza B By PCR: NEGATIVE

## 2016-12-01 LAB — GLUCOSE, CAPILLARY
GLUCOSE-CAPILLARY: 130 mg/dL — AB (ref 65–99)
Glucose-Capillary: 172 mg/dL — ABNORMAL HIGH (ref 65–99)
Glucose-Capillary: 169 mg/dL — ABNORMAL HIGH (ref 65–99)
Glucose-Capillary: 152 mg/dL — ABNORMAL HIGH (ref 65–99)

## 2016-12-01 LAB — HIV ANTIBODY (ROUTINE TESTING W REFLEX): HIV SCREEN 4TH GENERATION: NONREACTIVE

## 2016-12-01 MED ORDER — INSULIN DETEMIR 100 UNIT/ML ~~LOC~~ SOLN
5.0000 [IU] | Freq: Every day | SUBCUTANEOUS | Status: DC
  Filled 2016-12-01: qty 0.05

## 2016-12-01 MED ORDER — VANCOMYCIN HCL IN DEXTROSE 1-5 GM/200ML-% IV SOLN
1000.0000 mg | Freq: Two times a day (BID) | INTRAVENOUS | Status: DC
  Administered 2016-12-01 – 2016-12-02 (×3): 1000 mg via INTRAVENOUS
  Filled 2016-12-01 (×2): qty 200

## 2016-12-01 MED ORDER — DEXTROSE 5 % IV SOLN
1.0000 g | INTRAVENOUS | Status: DC
  Administered 2016-12-01 – 2016-12-02 (×2): 1 g via INTRAVENOUS
  Filled 2016-12-01 (×3): qty 10

## 2016-12-01 MED ORDER — INSULIN ASPART 100 UNIT/ML ~~LOC~~ SOLN: 0.0000 [IU] | Freq: Every day | SUBCUTANEOUS | Status: DC

## 2016-12-01 MED ORDER — INSULIN ASPART 100 UNIT/ML ~~LOC~~ SOLN
3.0000 [IU] | Freq: Three times a day (TID) | SUBCUTANEOUS | Status: DC
  Administered 2016-12-01 – 2016-12-02 (×4): 3 [IU] via SUBCUTANEOUS

## 2016-12-01 MED ORDER — INSULIN DETEMIR 100 UNIT/ML ~~LOC~~ SOLN
5.0000 [IU] | Freq: Two times a day (BID) | SUBCUTANEOUS | Status: DC
  Administered 2016-12-01 – 2016-12-03 (×4): 5 [IU] via SUBCUTANEOUS
  Filled 2016-12-01 (×5): qty 0.05

## 2016-12-01 MED ORDER — METRONIDAZOLE IN NACL 5-0.79 MG/ML-% IV SOLN
500.0000 mg | Freq: Three times a day (TID) | INTRAVENOUS | Status: DC
  Administered 2016-12-01 – 2016-12-03 (×6): 500 mg via INTRAVENOUS
  Filled 2016-12-01 (×7): qty 100

## 2016-12-01 MED ORDER — INSULIN ASPART 100 UNIT/ML ~~LOC~~ SOLN
0.0000 [IU] | Freq: Three times a day (TID) | SUBCUTANEOUS | Status: DC
  Administered 2016-12-01 (×2): 2 [IU] via SUBCUTANEOUS
  Administered 2016-12-02: 1 [IU] via SUBCUTANEOUS
  Administered 2016-12-02 (×2): 2 [IU] via SUBCUTANEOUS

## 2016-12-01 MED ORDER — LIVING WELL WITH DIABETES BOOK
Freq: Once | Status: AC
  Administered 2016-12-01: 15:00:00
  Filled 2016-12-01: qty 1

## 2016-12-01 NOTE — Progress Notes (Signed)
Spoke with patient about his diabetes. States that he was diagnosed about 15 years ago. Was on insulin initially and then taken off of it for a while.  He did not have health insurance for about 1 1/2 years, so did not take any medication. Has recently started on insurance, works for the city, and has [facility]. Was started on Metformin about 1 month ago.   Was at a clinic on Monday, not feeling well, thought he had the flu.  Noted that evening that he had a foot abscess after removing his sock and boot.  Then decided to seek help.  Hgba1C is 13.6% due to not having any medications for a while.   Will have dietician to see patient and will order Living Well with Diabetes booklet.  Staff RN can review insulin administration with patient in case he is discharged on insulin. Will continue to monitor blood sugars while in the hospital.  Smith MinceKendra Alenna Russell RN BSN CDE Diabetes Coordinator Pager: [phone]  8am-5pm

## 2016-12-01 NOTE — Progress Notes (Signed)
[facility] PROGRESS NOTE    Progress Note  Timothy Heath  WUJ:[id number] DOB: Jul 08, 1975 DOA: 11/29/2016 PCP: [name], MD     Brief Narrative:   Timothy Heath is an 41 y.o. male past medical history of diabetes mellitus type 2 ith a lasthemoglobin A1c Of 14, essential hypertension who presents with fever that started day prior to admission., the patient was moving a fever and cough on 11/28/2016, was started empirically on Tamiflu and sent home, however he did notice a new ulcer on his left foot Assessment/Plan:   Cellulitis and abscess of foot/ Foot abscess, left/Diabetic foot ulcer: Started empirically on IV vancomycin and cefepime, wound care recommended dressing changes twice a day moist dry. HIV is negative, influenza PCR is pending. Discontinue IV cefepime start IV Rocephin, continue IV vancomycin. His ulcer has only been there 2 days, is less than 2 cm wide some erythema along the borders, the skin surrounding the cut is mildly tender and warm to touch. Keep foot elevated above the heart level.  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: Continue hold metformin and start Lantus plus sliding scale  DVT prophylaxis: lovenox Family Communication:wife Disposition Plan/Barrier to D/C: home in 2 days Code Status:     Code Status Orders        Start     Ordered   11/30/16 0948  Full code  Continuous     11/30/16 0947    Code Status History    Date Active Date Inactive Code Status Order ID Comments User Context   10/03/2015  6:50 PM 10/04/2015  7:00 PM Full Code [id number]  Barnetta Chapel, MD Inpatient        IV Access:    Peripheral IV   Procedures and diagnostic studies:   Dg Chest 2 View  Result Date: 11/29/2016 CLINICAL DATA:  Fever cough. EXAM: CHEST  2 VIEW COMPARISON:  10/03/2015 FINDINGS: AP and lateral views of the chest obtained. The lungs are clear without focal pneumonia, edema, pneumothorax or pleural effusion. The  cardiopericardial silhouette is within normal limits for size. The visualized bony structures of the thorax are intact. Telemetry leads overlie the chest. IMPRESSION: No active cardiopulmonary disease. Electronically Signed   By: Kennith Center M.D.   On: 11/29/2016 18:50   Dg Foot Complete Left  Result Date: 11/30/2016 CLINICAL DATA:  Ulceration along the plantar aspect of the left foot, at the first metatarsophalangeal joint. Initial encounter. EXAM: LEFT FOOT - COMPLETE 3+ VIEW COMPARISON:  None. FINDINGS: There is no evidence of fracture or dislocation. No osseous erosions are seen. The joint spaces are preserved. There is no evidence of talar subluxation; the subtalar joint is unremarkable in appearance. A soft tissue ulceration is noted at the medial aspect of the first metatarsophalangeal joint. No radiopaque foreign bodies are seen. IMPRESSION: No evidence of fracture or dislocation.  No osseous erosions seen. Electronically Signed   By: Roanna Raider M.D.   On: 11/30/2016 01:21     Medical Consultants:    None.  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 Denies any nausea or vomiting.  Objective:    Vitals:   11/30/16 0900 11/30/16 1358 11/30/16 2100 12/01/16 0540  BP:  123/90 121/81 132/82  Pulse:  93 94 94  Resp:  18 18 18   Temp: (!) 102 F (38.9 C) 99.7 F (37.6 C) 99.9 F (  37.7 C) (!) 100.4 F (38 C)  TempSrc: Oral Oral Oral Oral  SpO2:  100% 100% 100%  Weight:      Height:        Intake/Output Summary (Last 24 hours) at 12/01/16 0830 Last data filed at 11/30/16 1434  Gross per 24 hour  Intake             1050 ml  Output                0 ml  Net             1050 ml   Filed Weights   11/29/16 2230 11/30/16 0300  Weight: 113.4 kg (250 lb) 113.4 kg (250 lb)    Exam: General exam: In no acute distress. Respiratory system: Good air movement and  clear to auscultation. Cardiovascular system: Regular in rhythm with positive S1-S2 no appreciated JVD Gastrointestinal system: Abdomen is nondistended, soft and nontender.  Central nervous system: Alert and oriented. No focal neurological deficits. Extremities: No pedal edema. Skin: Has a small cut on the left medial side of the foot with erythema that is warm and tender to palpation. His small ulcer is about a centimeter Psychiatry: Judgement and insight appear normal. Mood & affect appropriate.    Data Reviewed:    Labs: Basic Metabolic Panel:  Recent Labs Lab 11/29/16 1736 11/29/16 2330 12/01/16 0540  NA 131* 131* 137  K 4.0 4.0 3.5  CL 96* 98* 106  CO2 26 23 22   GLUCOSE 366* 333* 131*  BUN 12 17 10   CREATININE 1.84* 1.79* 1.25*  CALCIUM 8.7* 8.3* 7.9*   GFR Estimated Creatinine Clearance: 103.1 mL/min (A) (by C-G formula based on SCr of 1.25 mg/dL (H)). Liver Function Tests:  Recent Labs Lab 11/29/16 2330  AST 25  ALT 13*  ALKPHOS 82  BILITOT 1.3*  PROT 6.9  ALBUMIN 3.3*   No results for input(s): LIPASE, AMYLASE in the last 168 hours. No results for input(s): AMMONIA in the last 168 hours. Coagulation profile No results for input(s): INR, PROTIME in the last 168 hours.  CBC:  Recent Labs Lab 11/29/16 1736 11/29/16 2330 12/01/16 0540  WBC 20.2* 16.0* 14.6*  NEUTROABS 17.0* 14.9*  --   HGB 13.0 12.8* 11.9*  HCT 38.9* 37.5* 35.2*  MCV 76.3* 75.8* 76.9*  PLT 221 212 189   Cardiac Enzymes: No results for input(s): CKTOTAL, CKMB, CKMBINDEX, TROPONINI in the last 168 hours. BNP (last 3 results) No results for input(s): PROBNP in the last 8760 hours. CBG:  Recent Labs Lab 11/30/16 0741 11/30/16 1149 11/30/16 1700 11/30/16 2059 12/01/16 0744  GLUCAP 233* 204* 147* 142* 130*   D-Dimer: No results for input(s): DDIMER in the last 72 hours. Hgb A1c: No results for input(s): HGBA1C in the last 72 hours. Lipid Profile: No results for input(s):  CHOL, HDL, LDLCALC, TRIG, CHOLHDL, LDLDIRECT in the last 72 hours. Thyroid function studies: No results for input(s): TSH, T4TOTAL, T3FREE, THYROIDAB in the last 72 hours.  Invalid input(s): FREET3 Anemia work up: No results for input(s): VITAMINB12, FOLATE, FERRITIN, TIBC, IRON, RETICCTPCT in the last 72 hours. Sepsis Labs:  Recent Labs Lab 11/29/16 1736 11/29/16 2330 11/29/16 2342 11/30/16 0149 11/30/16 0521 12/01/16 0540  WBC 20.2* 16.0*  --   --   --  14.6*  LATICACIDVEN  --   --  3.08* 1.42 1.1  --    Microbiology Recent Results (from the past 240 hour(s))  Wound or Superficial Culture  Status: None (Preliminary result)   Collection Time: 11/30/16 12:44 AM  Result Value Ref Range Status   Specimen Description FOOT LEFT  Final   Special Requests NONE  Final   Gram Stain   Final    RARE WBC PRESENT, PREDOMINANTLY PMN ABUNDANT GRAM POSITIVE COCCI RARE GRAM POSITIVE RODS Performed at [facility], [address] [place], [place] [address]    Culture PENDING  Incomplete   Report Status PENDING  Incomplete  MRSA PCR Screening     Status: None   Collection Time: 11/30/16 12:51 PM  Result Value Ref Range Status   MRSA by PCR NEGATIVE NEGATIVE Final    Comment:        The GeneXpert MRSA Assay (FDA approved for NASAL specimens only), is one component of a comprehensive MRSA colonization surveillance program. It is not intended to diagnose MRSA infection nor to guide or monitor treatment for MRSA infections.      Medications:   . docusate sodium  100 mg Oral BID  . enoxaparin (LOVENOX) injection  0.5 mg/kg Subcutaneous Q24H  . insulin aspart  0-15 Units Subcutaneous TID AC & HS  . oseltamivir  75 mg Oral Q12H   Continuous Infusions: . sodium chloride 100 mL/hr at 11/30/16 1358  . ceFEPime (MAXIPIME) IV Stopped (11/30/16 2234)  . vancomycin 1,000 mg (11/30/16 2235)      LOS: 1 day   Marinda Elk  [facility] Pager  [phone]  *Please refer to [email or web address], password [email or web address] to get updated schedule on who will round on this patient, as hospitalists switch teams weekly. If 7PM-7AM, please contact night-coverage at [email or web address], password [email or web address] for any overnight needs.  12/01/2016, 8:30 AM

## 2016-12-01 NOTE — Care Management Note (Signed)
Case Management Note  Patient Details  Name: Timothy RutherfordJoseph A Chrismer MRN: [id number] Date of Birth: [birth year]  Subjective/Objective:                  41 y.o. male, with past medical history significant for diabetes mellitus and hypertension presenting today with fever and left foot also noted yesterday. Patient had fever with cough that started on Saturday and he was started empirically on Tamiflu yesterday and was sent home. However, he noticed a new ulcer on his left foot and came back again and was admitted for left foot ulcer and fever. Flu PCR was ordered and is pending . Patient is started on Tamiflu and IV antibiotics and wound care nurse to evaluate patient.   Action/Plan: Date:  December 01 2016 Chart reviewed for concurrent status and case management needs.  Will continue to follow patient progress.  Discharge Planning: following for needs  Expected discharge date: December 04, 2016  Marcelle SmilingRhonda Licia Harl, BSN, NibbeRN3, [place]M   [phone]   Expected Discharge Date:                  Expected Discharge Plan:  Home/Self Care  In-House Referral:     Discharge planning Services  CM Consult  Post Acute Care Choice:    Choice offered to:     DME Arranged:    DME Agency:     HH Arranged:    HH Agency:     Status of Service:  In process, will continue to follow  If discussed at Long Length of Stay Meetings, dates discussed:    Additional Comments:  Golda AcreDavis, Laquitta Dominski Lynn, RN 12/01/2016, 10:08 AM

## 2016-12-01 NOTE — Progress Notes (Signed)
RN explained and educated patient and family member about checking BG at home; insulin administration; how to perform foot care as living well book provided.

## 2016-12-02 ENCOUNTER — Encounter (HOSPITAL_COMMUNITY): Payer: Self-pay

## 2016-12-02 DIAGNOSIS — E11621 Type 2 diabetes mellitus with foot ulcer: Principal | ICD-10-CM

## 2016-12-02 DIAGNOSIS — L97528 Non-pressure chronic ulcer of other part of left foot with other specified severity: Secondary | ICD-10-CM

## 2016-12-02 LAB — GLUCOSE, CAPILLARY
GLUCOSE-CAPILLARY: 194 mg/dL — AB (ref 65–99)
GLUCOSE-CAPILLARY: 127 mg/dL — AB (ref 65–99)
GLUCOSE-CAPILLARY: 98 mg/dL (ref 65–99)
Glucose-Capillary: 185 mg/dL — ABNORMAL HIGH (ref 65–99)

## 2016-12-02 LAB — CBC
HEMATOCRIT: 35.8 % — AB (ref 39.0–52.0)
Hemoglobin: 12 g/dL — ABNORMAL LOW (ref 13.0–17.0)
MCH: 25.6 pg — AB (ref 26.0–34.0)
MCHC: 33.5 g/dL (ref 30.0–36.0)
MCV: 76.3 fL — ABNORMAL LOW (ref 78.0–100.0)
Platelets: 223 10*3/uL (ref 150–400)
RBC: 4.69 MIL/uL (ref 4.22–5.81)
RDW: 13.7 % (ref 11.5–15.5)
WBC: 11 10*3/uL — ABNORMAL HIGH (ref 4.0–10.5)

## 2016-12-02 MED ORDER — METOCLOPRAMIDE HCL 5 MG/ML IJ SOLN
5.0000 mg | Freq: Once | INTRAMUSCULAR | Status: AC
Start: 2016-12-02 — End: 2016-12-02
  Administered 2016-12-02: 5 mg via INTRAVENOUS
  Filled 2016-12-02: qty 2

## 2016-12-02 MED ORDER — PROMETHAZINE HCL 25 MG/ML IJ SOLN
12.5000 mg | Freq: Four times a day (QID) | INTRAMUSCULAR | Status: DC | PRN
  Administered 2016-12-02: 12.5 mg via INTRAVENOUS
  Filled 2016-12-02: qty 1

## 2016-12-02 NOTE — Progress Notes (Signed)
Nutrition Note  RD consulted for nutrition education regarding diabetes.   Lab Results  Component Value Date   HGBA1C 13.6 (H) 12/01/2016    RD provided "Carbohydrate Counting for People with Diabetes" handout from the Academy of Nutrition and Dietetics. Discussed different food groups and their effects on blood sugar, emphasizing carbohydrate-containing foods. Provided list of carbohydrates and recommended serving sizes of common foods.  Body mass index is 33.91 kg/m. Pt meets criteria for obesity based on current BMI.  Current diet order is CHO modified, patient is consuming approximately 0% of meals at this time. Labs and medications reviewed.  Education not provided in detail today given that patient is having N/V. Pt received dose of Zofran and felt sleepy this morning. Provided handouts for pt to review. No family in room at that time. RD returned to complete education and pt was having another episode of N/V.  Will return to complete education if schedule permits.  Timothy FrancoLindsey Dalayla Aldredge, MS, RD, LDN [facility] Clinical Dietitian Pager: [phone] After Hours Pager: [phone]

## 2016-12-02 NOTE — Progress Notes (Signed)
[facility] PROGRESS NOTE    Progress Note  Timothy Heath  ZOX:[id number] DOB: 12-19-1975 DOA: 11/29/2016 PCP: [name], MD     Brief Narrative:   Timothy Heath is an 41 y.o. male past medical history of diabetes mellitus type 2 ith a lasthemoglobin A1c Of 14, essential hypertension who presents with fever that started day prior to admission., the patient was moving a fever and cough on 11/28/2016, was started empirically on Tamiflu and sent home, however he did notice a new ulcer on his left foot Assessment/Plan:   Cellulitis and abscess of foot/ Foot abscess, left/Diabetic foot ulcer: Started empirically on IV vancomycin and cefepime, wound care recommended dressing changes twice a day moist dry. HIV is negative, influenza PCR is negative. Continue IV vancomycin and Rocephin.  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 Code Status:     Code Status Orders        Start     Ordered   11/30/16 0948  Full code  Continuous     11/30/16 0947    Code Status History    Date Active Date Inactive Code Status Order ID Comments User Context   10/03/2015  6:50 PM 10/04/2015  7:00 PM Full Code [id number]  Barnetta Chapel, MD Inpatient        IV Access:    Peripheral IV   Procedures and diagnostic studies:   No results found.   Medical Consultants:    None.  Anti-Infectives:   IV vancomycin and Rocephin  Subjective:    Timothy Heath he relates his foot feels much better no new complaints.  Objective:    Vitals:   12/01/16 0540 12/01/16 1448 12/01/16 2117 12/02/16 0455  BP: 132/82 136/89 (!) 155/90 (!) 164/94  Pulse: 94 87 86 85  Resp: 18  18 18   Temp: (!) 100.4 F (38 C) 97.7 F (36.5 C) 98.6 F (37 C) 98.4 F (36.9 C)  TempSrc: Oral Axillary Oral Oral  SpO2:  100% 97% 98% 99%  Weight:      Height:        Intake/Output Summary (Last 24 hours) at 12/02/16 1029 Last data filed at 12/02/16 0523  Gross per 24 hour  Intake              500 ml  Output                0 ml  Net              500 ml   Filed Weights   11/29/16 2230 11/30/16 0300  Weight: 113.4 kg (250 lb) 113.4 kg (250 lb)    Exam: General exam: In no acute distress. Respiratory system: Good air movement and clear to auscultation. Cardiovascular system: Regular rate and rhythm with positive S1-S2 Gastrointestinal system: Abdomen is nondistended, soft and nontender.  Central nervous system: Alert and oriented. No focal neurological deficits. Extremities: No pedal edema. Skin: Erythema has significantly improved, not tender to palpation. Psychiatry: Judgement and insight appear normal. Mood & affect appropriate.    Data Reviewed:    Labs: Basic Metabolic Panel:  Recent Labs Lab 11/29/16 1736 11/29/16 2330 12/01/16 0540  NA 131* 131* 137  K 4.0 4.0 3.5  CL 96* 98* 106  CO2 26 23 22   GLUCOSE 366* 333* 131*  BUN 12 17 10  CREATININE 1.84* 1.79* 1.25*  CALCIUM 8.7* 8.3* 7.9*   GFR Estimated Creatinine Clearance: 103.1 mL/min (A) (by C-G formula based on SCr of 1.25 mg/dL (H)). Liver Function Tests:  Recent Labs Lab 11/29/16 2330  AST 25  ALT 13*  ALKPHOS 82  BILITOT 1.3*  PROT 6.9  ALBUMIN 3.3*   No results for input(s): LIPASE, AMYLASE in the last 168 hours. No results for input(s): AMMONIA in the last 168 hours. Coagulation profile No results for input(s): INR, PROTIME in the last 168 hours.  CBC:  Recent Labs Lab 11/29/16 1736 11/29/16 2330 12/01/16 0540 12/02/16 0545  WBC 20.2* 16.0* 14.6* 11.0*  NEUTROABS 17.0* 14.9*  --   --   HGB 13.0 12.8* 11.9* 12.0*  HCT 38.9* 37.5* 35.2* 35.8*  MCV 76.3* 75.8* 76.9* 76.3*  PLT 221 212 189 223   Cardiac Enzymes: No results for input(s): CKTOTAL, CKMB, CKMBINDEX, TROPONINI in the last 168  hours. BNP (last 3 results) No results for input(s): PROBNP in the last 8760 hours. CBG:  Recent Labs Lab 12/01/16 0744 12/01/16 1208 12/01/16 1652 12/01/16 2116 12/02/16 0716  GLUCAP 130* 172* 169* 152* 185*   D-Dimer: No results for input(s): DDIMER in the last 72 hours. Hgb A1c:  Recent Labs  12/01/16 0540  HGBA1C 13.6*   Lipid Profile: No results for input(s): CHOL, HDL, LDLCALC, TRIG, CHOLHDL, LDLDIRECT in the last 72 hours. Thyroid function studies: No results for input(s): TSH, T4TOTAL, T3FREE, THYROIDAB in the last 72 hours.  Invalid input(s): FREET3 Anemia work up: No results for input(s): VITAMINB12, FOLATE, FERRITIN, TIBC, IRON, RETICCTPCT in the last 72 hours. Sepsis Labs:  Recent Labs Lab 11/29/16 1736 11/29/16 2330 11/29/16 2342 11/30/16 0149 11/30/16 0521 12/01/16 0540 12/02/16 0545  WBC 20.2* 16.0*  --   --   --  14.6* 11.0*  LATICACIDVEN  --   --  3.08* 1.42 1.1  --   --    Microbiology Recent Results (from the past 240 hour(s))  Blood Culture (routine x 2)     Status: None (Preliminary result)   Collection Time: 11/29/16 11:26 PM  Result Value Ref Range Status   Specimen Description BLOOD BLOOD LEFT FOREARM  Final   Special Requests   Final    BOTTLES DRAWN AEROBIC AND ANAEROBIC Blood Culture adequate volume   Culture   Final    NO GROWTH 1 DAY Performed at [facility], [address], [place], [place] [address]    Report Status PENDING  Incomplete  Blood Culture (routine x 2)     Status: None (Preliminary result)   Collection Time: 11/29/16 11:35 PM  Result Value Ref Range Status   Specimen Description BLOOD RIGHT ANTECUBITAL  Final   Special Requests   Final    BOTTLES DRAWN AEROBIC AND ANAEROBIC Blood Culture adequate volume   Culture   Final    NO GROWTH 1 DAY Performed at [facility], [address], [place], [place] [address]    Report Status PENDING  Incomplete  Wound or Superficial Culture     Status: None  (Preliminary result)   Collection Time: 11/30/16 12:44 AM  Result Value Ref Range Status   Specimen Description FOOT LEFT  Final   Special Requests NONE  Final   Gram Stain   Final    RARE WBC PRESENT, PREDOMINANTLY PMN ABUNDANT GRAM POSITIVE COCCI RARE GRAM POSITIVE RODS    Culture   Final    CULTURE REINCUBATED FOR BETTER  GROWTH Performed at [facility], [address] [place], [place] [id number]    Report Status PENDING  Incomplete  MRSA PCR Screening     Status: None   Collection Time: 11/30/16 12:51 PM  Result Value Ref Range Status   MRSA by PCR NEGATIVE NEGATIVE Final    Comment:        The GeneXpert MRSA Assay (FDA approved for NASAL specimens only), is one component of a comprehensive MRSA colonization surveillance program. It is not intended to diagnose MRSA infection nor to guide or monitor treatment for MRSA infections.      Medications:   . docusate sodium  100 mg Oral BID  . enoxaparin (LOVENOX) injection  0.5 mg/kg Subcutaneous Q24H  . insulin aspart  0-5 Units Subcutaneous QHS  . insulin aspart  0-9 Units Subcutaneous TID WC  . insulin aspart  3 Units Subcutaneous TID WC  . insulin detemir  5 Units Subcutaneous BID   Continuous Infusions: . sodium chloride 100 mL/hr at 12/01/16 2104  . cefTRIAXone (ROCEPHIN)  IV 1 g (12/02/16 0956)  . metronidazole Stopped (12/02/16 0523)  . vancomycin 1,000 mg (12/02/16 0956)      LOS: 2 days   Marinda ElkFELIZ ORTIZ, Scott Fix  [facility] Pager [phone]  *Please refer to [email or web address], password [email or web address] to get updated schedule on who will round on this patient, as hospitalists switch teams weekly. If 7PM-7AM, please contact night-coverage at [email or web address], password [email or web address] for any overnight needs.  12/02/2016, 10:29 AM

## 2016-12-02 NOTE — Plan of Care (Signed)
Problem: Safety: Goal: Ability to remain free from injury will improve Outcome: Progressing Safety precautions maintained  Problem: Pain Managment: Goal: General experience of comfort will improve Outcome: Progressing Medicated once for pain with full relief  Problem: Activity: Goal: Risk for activity intolerance will decrease Outcome: Progressing Tolerates activities well  Problem: Bowel/Gastric: Goal: Will not experience complications related to bowel motility Outcome: Progressing No bowel complications reported

## 2016-12-03 ENCOUNTER — Encounter (HOSPITAL_BASED_OUTPATIENT_CLINIC_OR_DEPARTMENT_OTHER): Payer: Self-pay

## 2016-12-03 ENCOUNTER — Emergency Department (HOSPITAL_BASED_OUTPATIENT_CLINIC_OR_DEPARTMENT_OTHER)
Admission: EM | Admit: 2016-12-03 | Discharge: 2016-12-04 | Disposition: A | Discharge status: Home / Self Care | Payer: 59 | Attending: Physician Assistant | Admitting: Physician Assistant

## 2016-12-03 ENCOUNTER — Emergency Department (HOSPITAL_BASED_OUTPATIENT_CLINIC_OR_DEPARTMENT_OTHER): Payer: 59

## 2016-12-03 DIAGNOSIS — Z794 Long term (current) use of insulin: Secondary | ICD-10-CM

## 2016-12-03 DIAGNOSIS — I129 Hypertensive chronic kidney disease with stage 1 through stage 4 chronic kidney disease, or unspecified chronic kidney disease: Secondary | ICD-10-CM | POA: Insufficient documentation

## 2016-12-03 DIAGNOSIS — N183 Chronic kidney disease, stage 3 (moderate): Secondary | ICD-10-CM | POA: Insufficient documentation

## 2016-12-03 DIAGNOSIS — J45909 Unspecified asthma, uncomplicated: Secondary | ICD-10-CM | POA: Insufficient documentation

## 2016-12-03 DIAGNOSIS — J189 Pneumonia, unspecified organism: Secondary | ICD-10-CM | POA: Insufficient documentation

## 2016-12-03 DIAGNOSIS — E1122 Type 2 diabetes mellitus with diabetic chronic kidney disease: Secondary | ICD-10-CM | POA: Insufficient documentation

## 2016-12-03 DIAGNOSIS — E1165 Type 2 diabetes mellitus with hyperglycemia: Secondary | ICD-10-CM

## 2016-12-03 LAB — BLOOD CULTURE ID PANEL (REFLEXED)
Acinetobacter baumannii: NOT DETECTED
CANDIDA PARAPSILOSIS: NOT DETECTED
CANDIDA TROPICALIS: NOT DETECTED
Candida albicans: NOT DETECTED
Candida glabrata: NOT DETECTED
Candida krusei: NOT DETECTED
ENTEROBACTER CLOACAE COMPLEX: NOT DETECTED
ENTEROBACTERIACEAE SPECIES: NOT DETECTED
Enterococcus species: NOT DETECTED
Escherichia coli: NOT DETECTED
Haemophilus influenzae: NOT DETECTED
KLEBSIELLA OXYTOCA: NOT DETECTED
KLEBSIELLA PNEUMONIAE: NOT DETECTED
Listeria monocytogenes: NOT DETECTED
Neisseria meningitidis: NOT DETECTED
PROTEUS SPECIES: NOT DETECTED
Pseudomonas aeruginosa: NOT DETECTED
STAPHYLOCOCCUS SPECIES: NOT DETECTED
STREPTOCOCCUS AGALACTIAE: NOT DETECTED
STREPTOCOCCUS SPECIES: NOT DETECTED
Serratia marcescens: NOT DETECTED
Staphylococcus aureus (BCID): NOT DETECTED
Streptococcus pneumoniae: NOT DETECTED
Streptococcus pyogenes: NOT DETECTED

## 2016-12-03 LAB — GLUCOSE, CAPILLARY
GLUCOSE-CAPILLARY: 118 mg/dL — AB (ref 65–99)
Glucose-Capillary: 195 mg/dL — ABNORMAL HIGH (ref 65–99)

## 2016-12-03 MED ORDER — CEPHALEXIN 500 MG PO CAPS: 500.0000 mg | ORAL_CAPSULE | Freq: Two times a day (BID) | ORAL | Status: DC

## 2016-12-03 MED ORDER — SULFAMETHOXAZOLE-TRIMETHOPRIM 800-160 MG PO TABS: 1.0000 | ORAL_TABLET | Freq: Two times a day (BID) | ORAL | 0 refills | Status: DC

## 2016-12-03 MED ORDER — CEFUROXIME AXETIL 500 MG PO TABS
500.0000 mg | ORAL_TABLET | Freq: Two times a day (BID) | ORAL | Status: DC
  Administered 2016-12-03: 500 mg via ORAL
  Filled 2016-12-03: qty 1

## 2016-12-03 MED ORDER — SULFAMETHOXAZOLE-TRIMETHOPRIM 800-160 MG PO TABS
1.0000 | ORAL_TABLET | Freq: Two times a day (BID) | ORAL | Status: DC
  Administered 2016-12-03: 1 via ORAL
  Filled 2016-12-03: qty 1

## 2016-12-03 MED ORDER — GLIPIZIDE 5 MG PO TABS: 5.0000 mg | ORAL_TABLET | Freq: Every day | ORAL | 0 refills | Status: AC

## 2016-12-03 MED ORDER — METFORMIN HCL 1000 MG PO TABS: 1000.0000 mg | ORAL_TABLET | Freq: Two times a day (BID) | ORAL | 1 refills | Status: DC

## 2016-12-03 MED ORDER — CEFUROXIME AXETIL 500 MG PO TABS: 500.0000 mg | ORAL_TABLET | Freq: Two times a day (BID) | ORAL | 0 refills | Status: DC

## 2016-12-03 NOTE — Progress Notes (Signed)
Nutrition Brief Note  RD attempted to speak with patient 10/25 regarding diabetes education. Pt was experiencing nausea/vomiting. RD left "Carbohydrate Counting for People with Diabetes" handout from the Academy of Nutrition and Dietetics.   Today RD, discussed importance of controlled and consistent carbohydrate intake throughout the day. Provided examples of ways to balance meals/snacks and encouraged intake of high-fiber, whole grain complex carbohydrates. Teach back method used.  Expect fair compliance. Pt works long hours with sanitation. Discussed food options pt could have throughout the day that works for his schedule. Talked about decreasing his juice and regular soda intake. Pt amendable to diet soda and sugar free beverages.   Vanessa Kickarly Marbeth Smedley RD, LDN Clinical Nutrition Pager # [phone]

## 2016-12-03 NOTE — Progress Notes (Signed)
Pt discharged. VSS. MD aware low grade temp and cough. MD states antibiotics will cover any respiratory illness.  IV d/c with cath intact. Discharge instructions given and pt verbalized understanding.

## 2016-12-03 NOTE — ED Triage Notes (Signed)
Pt was d/c from hosp today-admitted for foot infection-c/o cough that started yesterday while in the hosp-CP started today-NAD-presents to triage in w/c

## 2016-12-03 NOTE — ED Notes (Signed)
Patient transported to X-ray 

## 2016-12-03 NOTE — Discharge Instructions (Signed)
Timothy DuffelJoseph Heath was admitted to the Hospital on 11/29/2016 and Discharged on Discharge Date 12/03/2016 and should be excused from work/school   for 5  days starting 11/29/2016 , may return to work/school without any restrictions.  Call Timothy KetoAbraham Feliz MD, [facility] [phone] with questions.  Timothy ElkFELIZ ORTIZ, Timothy Heath M.D on 12/03/2016,at 11:33 AM  [facility] Office  [phone]

## 2016-12-03 NOTE — Discharge Summary (Signed)
Physician Discharge Summary  Timothy Heath WGN:[id number] DOB: February 22, 1975 DOA: 11/29/2016  PCP: [name], MD  Admit date: 11/29/2016 Discharge date: 12/03/2016  Admitted From: Home Disposition:  Home  Recommendations for Outpatient Follow-up:  1. Follow up with PCP in 1-2 weeks, will need education about his diabetes. 2. He will check CBGs before meals and at bedtime.  Home Health:No Equipment/Devices:none  Discharge Condition:Stable CODE STATUS:Full Diet recommendation: Carb Modified  Brief/Interim Summary: 41 y.o. 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 HIV is negative, influenza PCR is negative. He defervesced he relates his pain improved, his skin erythema also improved. So he was changed to oral doxycycline and Ceftin which she will continue for 10 days and outpatient.  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: Metformin was held and admission resume his outpatient, his hemoglobin A1c was 13.5, but they do noncompliance. Glipizide was added orally. Here in the hospital he was controlled with minimal insulin.   Discharge Instructions  Discharge Instructions    Diet - low sodium heart healthy    Complete by:  As  directed    Increase activity slowly    Complete by:  As directed      Allergies as of 12/03/2016      Reactions   Bee Venom Anaphylaxis   Penicillins Anaphylaxis, Shortness Of Breath   Has patient had a PCN reaction causing immediate rash, facial/tongue/throat swelling, SOB or lightheadedness with hypotension:YES Has patient had a PCN reaction causing severe rash involving mucus membranes or skin necrosis:  NO Has patient had a PCN reaction that required hospitalization YES Has patient had a PCN reaction occurring within the last 10 years: NO If all of the above answers are "NO", then may proceed with Cephalosporin use.      Medication List    STOP taking these medications   oseltamivir 75 MG capsule Commonly known as:  TAMIFLU     TAKE these medications   cefUROXime 500 MG tablet Commonly known as:  CEFTIN Take 1 tablet (500 mg total) by mouth 2 (two) times daily with a meal.   glipiZIDE 5 MG tablet Commonly known as:  GLUCOTROL Take 1 tablet (5 mg total) by mouth daily.   lisinopril 10 MG tablet Commonly known as:  ZESTRIL Take 1 tablet (10 mg total) by mouth daily.   metFORMIN 1000 MG tablet Commonly known as:  GLUCOPHAGE Take 1 tablet (1,000 mg total) by mouth 2 (two) times daily with a meal.   metoCLOPramide 5 MG tablet Commonly known as:  REGLAN Take 1 tablet (5 mg total) by mouth every 6 (six) hours as needed for nausea or vomiting.   naphazoline-pheniramine 0.025-0.3 % ophthalmic solution Commonly known as:  NAPHCON-A Place 1  drop into the left eye every 4 (four) hours as needed for irritation.   ondansetron 8 MG disintegrating tablet Commonly known as:  ZOFRAN-ODT Take 1 tablet (8 mg total) by mouth every 8 (eight) hours as needed for nausea.   sulfamethoxazole-trimethoprim 800-160 MG tablet Commonly known as:  BACTRIM DS,SEPTRA DS Take 1 tablet by mouth every 12 (twelve) hours.   SUMAtriptan 50 MG tablet Commonly known as:  IMITREX Take 1 tablet  (50 mg total) by mouth every 2 (two) hours as needed for migraine or headache (Maximum 200mg  in one day). May repeat in 2 hours if headache persists or recurs.       Allergies  Allergen Reactions  . Bee Venom Anaphylaxis  . Penicillins Anaphylaxis and Shortness Of Breath    Has patient had a PCN reaction causing immediate rash, facial/tongue/throat swelling, SOB or lightheadedness with hypotension:YES Has patient had a PCN reaction causing severe rash involving mucus membranes or skin necrosis:  NO Has patient had a PCN reaction that required hospitalization YES Has patient had a PCN reaction occurring within the last 10 years: NO If all of the above answers are "NO", then may proceed with Cephalosporin use.     Consultations:  None   Procedures/Studies: Dg Chest 2 View  Result Date: 11/29/2016 CLINICAL DATA:  Fever cough. EXAM: CHEST  2 VIEW COMPARISON:  10/03/2015 FINDINGS: AP and lateral views of the chest obtained. The lungs are clear without focal pneumonia, edema, pneumothorax or pleural effusion. The cardiopericardial silhouette is within normal limits for size. The visualized bony structures of the thorax are intact. Telemetry leads overlie the chest. IMPRESSION: No active cardiopulmonary disease. Electronically Signed   By: Kennith Center M.D.   On: 11/29/2016 18:50   Dg Foot Complete Left  Result Date: 11/30/2016 CLINICAL DATA:  Ulceration along the plantar aspect of the left foot, at the first metatarsophalangeal joint. Initial encounter. EXAM: LEFT FOOT - COMPLETE 3+ VIEW COMPARISON:  None. FINDINGS: There is no evidence of fracture or dislocation. No osseous erosions are seen. The joint spaces are preserved. There is no evidence of talar subluxation; the subtalar joint is unremarkable in appearance. A soft tissue ulceration is noted at the medial aspect of the first metatarsophalangeal joint. No radiopaque foreign bodies are seen. IMPRESSION: No evidence of fracture or  dislocation.  No osseous erosions seen. Electronically Signed   By: Roanna Raider M.D.   On: 11/30/2016 01:21      Subjective: He relates his pain is much improved.  Discharge Exam: Vitals:   12/02/16 2108 12/03/16 0621  BP: (!) 172/92 (!) 162/74  Pulse: 86 87  Resp: 18 18  Temp: 98.7 F (37.1 C) 99.1 F (37.3 C)  SpO2: 97% 97%   Vitals:   12/02/16 0455 12/02/16 1351 12/02/16 2108 12/03/16 0621  BP: (!) 164/94 (!) 161/94 (!) 172/92 (!) 162/74  Pulse: 85 88 86 87  Resp: 18 18 18 18   Temp: 98.4 F (36.9 C) 98.8 F (37.1 C) 98.7 F (37.1 C) 99.1 F (37.3 C)  TempSrc: Oral Oral Oral Oral  SpO2: 99% 100% 97% 97%  Weight:      Height:        General: Pt is alert, awake, not in acute distress Cardiovascular: RRR, S1/S2 +, no rubs, no gallops Respiratory: CTA bilaterally, no wheezing, no rhonchi Abdominal: Soft, NT, ND, bowel sounds + Extremities: no edema, no cyanosis    The results of significant diagnostics from this hospitalization (including imaging, microbiology, ancillary  and laboratory) are listed below for reference.     Microbiology: Recent Results (from the past 240 hour(s))  Blood Culture (routine x 2)     Status: None (Preliminary result)   Collection Time: 11/29/16 11:26 PM  Result Value Ref Range Status   Specimen Description BLOOD BLOOD LEFT FOREARM  Final   Special Requests   Final    BOTTLES DRAWN AEROBIC AND ANAEROBIC Blood Culture adequate volume   Culture  Setup Time   Final    GRAM POSITIVE RODS AEROBIC BOTTLE ONLY CRITICAL RESULT CALLED TO, READ BACK BY AND VERIFIED WITH: D WOFFORD,PHARMD AT 1610 12/03/16 BY L BENFIELD Performed at [facility], [address] [place], [place] [address]    Culture GRAM POSITIVE RODS  Final   Report Status PENDING  Incomplete  Blood Culture ID Panel (Reflexed)     Status: None   Collection Time: 11/29/16 11:26 PM  Result Value Ref Range Status   Enterococcus species NOT DETECTED NOT DETECTED Final    Listeria monocytogenes NOT DETECTED NOT DETECTED Final   Staphylococcus species NOT DETECTED NOT DETECTED Final   Staphylococcus aureus NOT DETECTED NOT DETECTED Final   Streptococcus species NOT DETECTED NOT DETECTED Final   Streptococcus agalactiae NOT DETECTED NOT DETECTED Final   Streptococcus pneumoniae NOT DETECTED NOT DETECTED Final   Streptococcus pyogenes NOT DETECTED NOT DETECTED Final   Acinetobacter baumannii NOT DETECTED NOT DETECTED Final   Enterobacteriaceae species NOT DETECTED NOT DETECTED Final   Enterobacter cloacae complex NOT DETECTED NOT DETECTED Final   Escherichia coli NOT DETECTED NOT DETECTED Final   Klebsiella oxytoca NOT DETECTED NOT DETECTED Final   Klebsiella pneumoniae NOT DETECTED NOT DETECTED Final   Proteus species NOT DETECTED NOT DETECTED Final   Serratia marcescens NOT DETECTED NOT DETECTED Final   Haemophilus influenzae NOT DETECTED NOT DETECTED Final   Neisseria meningitidis NOT DETECTED NOT DETECTED Final   Pseudomonas aeruginosa NOT DETECTED NOT DETECTED Final   Candida albicans NOT DETECTED NOT DETECTED Final   Candida glabrata NOT DETECTED NOT DETECTED Final   Candida krusei NOT DETECTED NOT DETECTED Final   Candida parapsilosis NOT DETECTED NOT DETECTED Final   Candida tropicalis NOT DETECTED NOT DETECTED Final    Comment: Performed at [facility], [address], [place], [place] [address]  Blood Culture (routine x 2)     Status: None (Preliminary result)   Collection Time: 11/29/16 11:35 PM  Result Value Ref Range Status   Specimen Description BLOOD RIGHT ANTECUBITAL  Final   Special Requests   Final    BOTTLES DRAWN AEROBIC AND ANAEROBIC Blood Culture adequate volume   Culture   Final    NO GROWTH 2 DAYS Performed at [facility], [address], [place], [place] [address]    Report Status PENDING  Incomplete  Wound or Superficial Culture     Status: None (Preliminary result)   Collection Time: 11/30/16 12:44 AM   Result Value Ref Range Status   Specimen Description FOOT LEFT  Final   Special Requests NONE  Final   Gram Stain   Final    RARE WBC PRESENT, PREDOMINANTLY PMN ABUNDANT GRAM POSITIVE COCCI RARE GRAM POSITIVE RODS    Culture   Final    CULTURE REINCUBATED FOR BETTER GROWTH Performed at [facility], [address] [place], [place] [address]    Report Status PENDING  Incomplete  MRSA PCR Screening     Status: None  Collection Time: 11/30/16 12:51 PM  Result Value Ref Range Status   MRSA by PCR NEGATIVE NEGATIVE Final    Comment:        The GeneXpert MRSA Assay (FDA approved for NASAL specimens only), is one component of a comprehensive MRSA colonization surveillance program. It is not intended to diagnose MRSA infection nor to guide or monitor treatment for MRSA infections.      Labs: BNP (last 3 results) No results for input(s): BNP in the last 8760 hours. Basic Metabolic Panel:  Recent Labs Lab 11/29/16 1736 11/29/16 2330 12/01/16 0540  NA 131* 131* 137  K 4.0 4.0 3.5  CL 96* 98* 106  CO2 26 23 22   GLUCOSE 366* 333* 131*  BUN 12 17 10   CREATININE 1.84* 1.79* 1.25*  CALCIUM 8.7* 8.3* 7.9*   Liver Function Tests:  Recent Labs Lab 11/29/16 2330  AST 25  ALT 13*  ALKPHOS 82  BILITOT 1.3*  PROT 6.9  ALBUMIN 3.3*   No results for input(s): LIPASE, AMYLASE in the last 168 hours. No results for input(s): AMMONIA in the last 168 hours. CBC:  Recent Labs Lab 11/29/16 1736 11/29/16 2330 12/01/16 0540 12/02/16 0545  WBC 20.2* 16.0* 14.6* 11.0*  NEUTROABS 17.0* 14.9*  --   --   HGB 13.0 12.8* 11.9* 12.0*  HCT 38.9* 37.5* 35.2* 35.8*  MCV 76.3* 75.8* 76.9* 76.3*  PLT 221 212 189 223   Cardiac Enzymes: No results for input(s): CKTOTAL, CKMB, CKMBINDEX, TROPONINI in the last 168 hours. BNP: Invalid input(s): POCBNP CBG:  Recent Labs Lab 12/02/16 0716 12/02/16 1127 12/02/16 1713 12/02/16 2106 12/03/16 0744  GLUCAP 185* 194* 127* 98  118*   D-Dimer No results for input(s): DDIMER in the last 72 hours. Hgb A1c  Recent Labs  12/01/16 0540  HGBA1C 13.6*   Lipid Profile No results for input(s): CHOL, HDL, LDLCALC, TRIG, CHOLHDL, LDLDIRECT in the last 72 hours. Thyroid function studies No results for input(s): TSH, T4TOTAL, T3FREE, THYROIDAB in the last 72 hours.  Invalid input(s): FREET3 Anemia work up No results for input(s): VITAMINB12, FOLATE, FERRITIN, TIBC, IRON, RETICCTPCT in the last 72 hours. Urinalysis    Component Value Date/Time   COLORURINE YELLOW 11/30/2016 0045   APPEARANCEUR CLEAR 11/30/2016 0045   LABSPEC 1.020 11/30/2016 0045   PHURINE 5.5 11/30/2016 0045   GLUCOSEU >=500 (A) 11/30/2016 0045   HGBUR MODERATE (A) 11/30/2016 0045   BILIRUBINUR NEGATIVE 11/30/2016 0045   BILIRUBINUR negative 01/26/2016 0912   KETONESUR NEGATIVE 11/30/2016 0045   PROTEINUR 30 (A) 11/30/2016 0045   UROBILINOGEN 0.2 01/26/2016 0912   UROBILINOGEN 0.2 09/20/2011 1938   NITRITE NEGATIVE 11/30/2016 0045   LEUKOCYTESUR NEGATIVE 11/30/2016 0045   Sepsis Labs Invalid input(s): PROCALCITONIN,  WBC,  LACTICIDVEN Microbiology Recent Results (from the past 240 hour(s))  Blood Culture (routine x 2)     Status: None (Preliminary result)   Collection Time: 11/29/16 11:26 PM  Result Value Ref Range Status   Specimen Description BLOOD BLOOD LEFT FOREARM  Final   Special Requests   Final    BOTTLES DRAWN AEROBIC AND ANAEROBIC Blood Culture adequate volume   Culture  Setup Time   Final    GRAM POSITIVE RODS AEROBIC BOTTLE ONLY CRITICAL RESULT CALLED TO, READ BACK BY AND VERIFIED WITH: D WOFFORD,PHARMD AT 9629 12/03/16 BY L BENFIELD Performed at [facility], [address] [place], [place] [address]    Culture GRAM POSITIVE RODS  Final   Report  Status PENDING  Incomplete  Blood Culture ID Panel (Reflexed)     Status: None   Collection Time: 11/29/16 11:26 PM  Result Value Ref Range Status   Enterococcus  species NOT DETECTED NOT DETECTED Final   Listeria monocytogenes NOT DETECTED NOT DETECTED Final   Staphylococcus species NOT DETECTED NOT DETECTED Final   Staphylococcus aureus NOT DETECTED NOT DETECTED Final   Streptococcus species NOT DETECTED NOT DETECTED Final   Streptococcus agalactiae NOT DETECTED NOT DETECTED Final   Streptococcus pneumoniae NOT DETECTED NOT DETECTED Final   Streptococcus pyogenes NOT DETECTED NOT DETECTED Final   Acinetobacter baumannii NOT DETECTED NOT DETECTED Final   Enterobacteriaceae species NOT DETECTED NOT DETECTED Final   Enterobacter cloacae complex NOT DETECTED NOT DETECTED Final   Escherichia coli NOT DETECTED NOT DETECTED Final   Klebsiella oxytoca NOT DETECTED NOT DETECTED Final   Klebsiella pneumoniae NOT DETECTED NOT DETECTED Final   Proteus species NOT DETECTED NOT DETECTED Final   Serratia marcescens NOT DETECTED NOT DETECTED Final   Haemophilus influenzae NOT DETECTED NOT DETECTED Final   Neisseria meningitidis NOT DETECTED NOT DETECTED Final   Pseudomonas aeruginosa NOT DETECTED NOT DETECTED Final   Candida albicans NOT DETECTED NOT DETECTED Final   Candida glabrata NOT DETECTED NOT DETECTED Final   Candida krusei NOT DETECTED NOT DETECTED Final   Candida parapsilosis NOT DETECTED NOT DETECTED Final   Candida tropicalis NOT DETECTED NOT DETECTED Final    Comment: Performed at [facility], [address] [place], [place] [address]  Blood Culture (routine x 2)     Status: None (Preliminary result)   Collection Time: 11/29/16 11:35 PM  Result Value Ref Range Status   Specimen Description BLOOD RIGHT ANTECUBITAL  Final   Special Requests   Final    BOTTLES DRAWN AEROBIC AND ANAEROBIC Blood Culture adequate volume   Culture   Final    NO GROWTH 2 DAYS Performed at [facility], [address] [place], [place] [address]    Report Status PENDING  Incomplete  Wound or Superficial Culture     Status: None (Preliminary result)    Collection Time: 11/30/16 12:44 AM  Result Value Ref Range Status   Specimen Description FOOT LEFT  Final   Special Requests NONE  Final   Gram Stain   Final    RARE WBC PRESENT, PREDOMINANTLY PMN ABUNDANT GRAM POSITIVE COCCI RARE GRAM POSITIVE RODS    Culture   Final    CULTURE REINCUBATED FOR BETTER GROWTH Performed at [facility], [address] [place], [place] [address]    Report Status PENDING  Incomplete  MRSA PCR Screening     Status: None   Collection Time: 11/30/16 12:51 PM  Result Value Ref Range Status   MRSA by PCR NEGATIVE NEGATIVE Final    Comment:        The GeneXpert MRSA Assay (FDA approved for NASAL specimens only), is one component of a comprehensive MRSA colonization surveillance program. It is not intended to diagnose MRSA infection nor to guide or monitor treatment for MRSA infections.      Time coordinating discharge: Over 30 minutes  SIGNED:   Marinda ElkFELIZ ORTIZ, ABRAHAM, MD  [facility] 12/03/2016, 10:06 AM Pager   If 7PM-7AM, please contact night-coverage [email or web address] Password [email or web address]

## 2016-12-03 NOTE — Progress Notes (Signed)
PHARMACY - PHYSICIAN COMMUNICATION CRITICAL VALUE ALERT - BLOOD CULTURE IDENTIFICATION (BCID)  Results for orders placed or performed during the hospital encounter of 11/29/16  Blood Culture ID Panel (Reflexed) (Collected: 11/29/2016 11:26 PM)  Result Value Ref Range   Enterococcus species NOT DETECTED NOT DETECTED   Listeria monocytogenes NOT DETECTED NOT DETECTED   Staphylococcus species NOT DETECTED NOT DETECTED   Staphylococcus aureus NOT DETECTED NOT DETECTED   Streptococcus species NOT DETECTED NOT DETECTED   Streptococcus agalactiae NOT DETECTED NOT DETECTED   Streptococcus pneumoniae NOT DETECTED NOT DETECTED   Streptococcus pyogenes NOT DETECTED NOT DETECTED   Acinetobacter baumannii NOT DETECTED NOT DETECTED   Enterobacteriaceae species NOT DETECTED NOT DETECTED   Enterobacter cloacae complex NOT DETECTED NOT DETECTED   Escherichia coli NOT DETECTED NOT DETECTED   Klebsiella oxytoca NOT DETECTED NOT DETECTED   Klebsiella pneumoniae NOT DETECTED NOT DETECTED   Proteus species NOT DETECTED NOT DETECTED   Serratia marcescens NOT DETECTED NOT DETECTED   Haemophilus influenzae NOT DETECTED NOT DETECTED   Neisseria meningitidis NOT DETECTED NOT DETECTED   Pseudomonas aeruginosa NOT DETECTED NOT DETECTED   Candida albicans NOT DETECTED NOT DETECTED   Candida glabrata NOT DETECTED NOT DETECTED   Candida krusei NOT DETECTED NOT DETECTED   Candida parapsilosis NOT DETECTED NOT DETECTED   Candida tropicalis NOT DETECTED NOT DETECTED    Name of physician (or Provider) Contacted: Feliz-Ortiz  Changes to prescribed antibiotics required: none  Maurice MarchJackson, Tegh Franek E 12/03/2016  10:34 AM

## 2016-12-04 MED ORDER — LEVOFLOXACIN 750 MG PO TABS
750.0000 mg | ORAL_TABLET | Freq: Once | ORAL | Status: AC
  Administered 2016-12-04: 750 mg via ORAL
  Filled 2016-12-04: qty 1

## 2016-12-04 MED ORDER — LEVOFLOXACIN 750 MG PO TABS: 750.0000 mg | ORAL_TABLET | Freq: Every day | ORAL | 0 refills | Status: AC

## 2016-12-04 NOTE — Discharge Instructions (Signed)
Please return with any increasing cough, difficulty taking her medications, or worsening symptoms.  We think that you have likely developed pneumonia after hospital stay.  It is very possible that he will need to be readmitted given that he certainly is concerned times make people very sick.  However we will try oupatient treatment first.  We have given you 1 dose here you will need to come and follow fill the prescription tomorrow morning to continue take this medication as an outpatient.

## 2016-12-04 NOTE — ED Provider Notes (Signed)
[facility] EMERGENCY DEPARTMENT Provider Note   CSN: [id number] Arrival date & time: 12/03/16  2252     History   Chief Complaint Chief Complaint  Patient presents with  . Cough  . Chest Pain    HPI CLAYBORN MILNES is a 41 y.o. male.  HPI   Patient is a 41 year old male with past medical history significant for asthma and diabetes and hypertension.  Patient has had recent 5-day hospitalization for left lower extremity cellulitis.  Patient was discharged earlier today.  Before patient was released from the hospital, he spiked a fever, developed cough.  Family was told to watch for other symptoms.  Patient family brought him back here to the emergency department today because of cough.  Patient has mild pain with deep inspiration/cough, fever, cough.  Patient was discharged on Ceftin and Bactrim.  Has had no shortness of breath.  Past Medical History:  Diagnosis Date  . Allergy   . Asthma   . Diabetes mellitus   . Hypertension   . Noncompliance     Patient Active Problem List   Diagnosis Date Noted  . Diabetic foot ulcer associated with type 2 diabetes mellitus (HCC) 12/01/2016  . Cellulitis of left foot 11/30/2016  . Nausea and vomiting 10/03/2015  . Type 2 diabetes mellitus with stage 3 chronic kidney disease (HCC) 10/31/2006  . HYPERTENSION 10/31/2006  . ASTHMA 10/31/2006    Past Surgical History:  Procedure Laterality Date  . APPENDECTOMY    . FRACTURE SURGERY    . HERNIA REPAIR    . KNEE SURGERY         Home Medications    Prior to Admission medications   Medication Sig Start Date End Date Taking? Authorizing Provider  cefUROXime (CEFTIN) 500 MG tablet Take 1 tablet (500 mg total) by mouth 2 (two) times daily with a meal. 12/03/16   Marinda Elk, MD  glipiZIDE (GLUCOTROL) 5 MG tablet Take 1 tablet (5 mg total) by mouth daily. 12/03/16 12/03/17  Marinda Elk, MD  lisinopril (ZESTRIL) 10 MG tablet Take 1 tablet (10 mg  total) by mouth daily. Patient not taking: Reported on 11/29/2016 10/04/15   Berton Mount I, MD  metFORMIN (GLUCOPHAGE) 1000 MG tablet Take 1 tablet (1,000 mg total) by mouth 2 (two) times daily with a meal. 12/03/16   Marinda Elk, MD  metoCLOPramide (REGLAN) 5 MG tablet Take 1 tablet (5 mg total) by mouth every 6 (six) hours as needed for nausea or vomiting. Patient not taking: Reported on 11/29/2016 10/04/15   Barnetta Chapel, MD  naphazoline-pheniramine (NAPHCON-A) 0.025-0.3 % ophthalmic solution Place 1 drop into the left eye every 4 (four) hours as needed for irritation. Patient not taking: Reported on 11/29/2016 06/17/16   Barrett Henle, PA-C  ondansetron (ZOFRAN-ODT) 8 MG disintegrating tablet Take 1 tablet (8 mg total) by mouth every 8 (eight) hours as needed for nausea. Patient not taking: Reported on 11/29/2016 01/26/16   Trena Platt D, PA  sulfamethoxazole-trimethoprim (BACTRIM DS,SEPTRA DS) 800-160 MG tablet Take 1 tablet by mouth every 12 (twelve) hours. 12/03/16   Marinda Elk, MD  SUMAtriptan (IMITREX) 50 MG tablet Take 1 tablet (50 mg total) by mouth every 2 (two) hours as needed for migraine or headache (Maximum 200mg  in one day). May repeat in 2 hours if headache persists or recurs. Patient not taking: Reported on 11/29/2016 10/04/15   Barnetta Chapel, MD    Family History Family History  Problem  Relation Age of Onset  . Diabetes Mother   . Hypertension Mother   . Kidney disease Father   . Hypertension Father   . Diabetes Maternal Grandmother   . Hypertension Maternal Grandmother   . Stroke Maternal Grandmother   . Kidney disease Maternal Grandmother   . Depression Brother     Social History Social History  Substance Use Topics  . Smoking status: Never Smoker  . Smokeless tobacco: Never Used  . Alcohol use No     Allergies   Bee venom and Penicillins   Review of Systems Review of Systems  Constitutional: Positive  for fatigue and fever. Negative for activity change.  Respiratory: Negative for chest tightness and shortness of breath.   Cardiovascular: Positive for chest pain.  Gastrointestinal: Negative for abdominal pain.  All other systems reviewed and are negative.    Physical Exam Updated Vital Signs BP (!) 177/91 (BP Location: Left Arm)   Pulse 86   Temp 99.7 F (37.6 C) (Oral)   Resp 20   Ht 5\' 11"  (1.803 m)   Wt 117.9 kg (260 lb)   SpO2 98%   BMI 36.26 kg/m   Physical Exam  Constitutional: He is oriented to person, place, and time. He appears well-nourished.  African-American male with large BMI.  HENT:  Head: Normocephalic.  Eyes: Conjunctivae are normal.  Cardiovascular: Normal rate and regular rhythm.   No murmur heard. Pulmonary/Chest: Effort normal and breath sounds normal. No respiratory distress.  Abdominal: Soft. He exhibits no distension. There is no tenderness.  Musculoskeletal:  Left leg with mild warmth.  No erythema.  Neurological: He is oriented to person, place, and time.  Skin: Skin is warm and dry. He is not diaphoretic.  Psychiatric: He has a normal mood and affect. His behavior is normal.     ED Treatments / Results  Labs (all labs ordered are listed, but only abnormal results are displayed) Labs Reviewed - No data to display  EKG  EKG Interpretation  Date/Time:  Friday December 03 2016 23:09:48 EDT Ventricular Rate:  82 PR Interval:    QRS Duration: 84 QT Interval:  381 QTC Calculation: 445 R Axis:   62 Text Interpretation:  Sinus rhythm Probable anteroseptal infarct, old No significant change since last tracing Confirmed by Bary CastillaMackuen, Gianluca Chhim ([id number]) on 12/03/2016 11:12:10 PM       Radiology Dg Chest 2 View  Result Date: 12/03/2016 CLINICAL DATA:  41 year old male with chest pain and cough. EXAM: CHEST  2 VIEW COMPARISON:  Chest radiograph dated 11/29/2016 FINDINGS: There is an area of increased density in the right lower lobe. Streaky  density is also noted in the left mid to lower lung field. Findings concerning for developing pneumonia. Correlation with clinical exam and follow-up recommended. A small right pleural effusion is suspected. There is no pneumothorax. The cardiac silhouette is within normal limits. No acute osseous pathology. IMPRESSION: Developing infiltrate at the right lung base and possibly on the left. Clinical correlation and follow-up recommended. Electronically Signed   By: Elgie CollardArash  Radparvar M.D.   On: 12/03/2016 23:37    Procedures Procedures (including critical care time)  Medications Ordered in ED Medications - No data to display   Initial Impression / Assessment and Plan / ED Course  I have reviewed the triage vital signs and the nursing notes.  Pertinent labs & imaging results that were available during my care of the patient were reviewed by me and considered in my medical decision making (see chart  for details).      Patient is a 41 year old male with past medical history significant for asthma and diabetes and hypertension.  Patient has had recent 5-day hospitalization for left lower extremity cellulitis.  Patient was discharged earlier today.  Before patient was released from the hospital, he spiked a fever, developed cough.  Family was told to watch for other symptoms.  Patient family brought him back here to the emergency department today because of cough.  Patient has mild pain with deep inspiration/cough, fever, cough.  Patient was discharged on Ceftin and Bactrim.  Has had no shortness of breath.  12:08 AM Patient has what appears to be a developing pneumonia on x-ray.  I believe this is hospital-acquired pneumonia given recent hospitalization.  Less likely to be pulmonary embolism given that he has fever cough and unequal infiltrate.  Pt has no hypoxia, no tachypnea. I had patient centered discussion with family member and patient.  He had such a hard time sleeping in the hospital and  has been so exhausted and really does not want to be readmitted.  We will give him treatment for H CAP (Levaquin) to add his antibiotic therapy.  They have strict return precautions and understand the plan to try to treat as an outpatient but that patient may require inpatient treatment if he has any increasing shortness of breath, cough, or other concerns.  Final Clinical Impressions(s) / ED Diagnoses   Final diagnoses:  None    New Prescriptions New Prescriptions   No medications on file     Abelino Derrick, MD 12/04/16 0008

## 2016-12-05 LAB — CULTURE, BLOOD (ROUTINE X 2)
Culture: NO GROWTH
Special Requests: ADEQUATE

## 2016-12-07 LAB — CULTURE, BLOOD (ROUTINE X 2): Special Requests: ADEQUATE

## 2017-01-07 LAB — MISC LABCORP TEST (SEND OUT): Labcorp test code: 183285

## 2017-01-07 LAB — AEROBIC CULTURE  (SUPERFICIAL SPECIMEN)

## 2017-01-07 LAB — AEROBIC CULTURE W GRAM STAIN (SUPERFICIAL SPECIMEN)

## 2017-05-11 ENCOUNTER — Telehealth: Payer: Self-pay | Admitting: *Deleted

## 2017-05-11 NOTE — Telephone Encounter (Signed)
Dr. Laury Axonlowne is not accepting new patients at this time.  Can see if they would like to see someone else in office

## 2017-05-11 NOTE — Telephone Encounter (Signed)
Copied from CRM [id number]. Topic: Appointment Scheduling - Scheduling Inquiry for Clinic >> May 10, 2017  1:12 PM Crist InfanteHarrald, Kathy J wrote: Reason for CRM:  pt's girlfriend Charma IgoDebra Harris calling to ask if Dr Laury Axonlowne will accept this pt?  She states he sees a foot dr, Dr Fanny DanceJah who recommended Dr Laury AxonLowne, as pt has diabetes

## 2017-05-16 NOTE — Telephone Encounter (Signed)
Pt has an appt to establish with Dr. Carmelia RollerWendling on 05/30/17

## 2017-05-30 ENCOUNTER — Ambulatory Visit: Payer: Self-pay | Admitting: Family Medicine

## 2017-05-30 DIAGNOSIS — Z0289 Encounter for other administrative examinations: Secondary | ICD-10-CM

## 2017-06-16 ENCOUNTER — Ambulatory Visit: Payer: Self-pay | Admitting: Family Medicine

## 2017-06-16 DIAGNOSIS — Z0289 Encounter for other administrative examinations: Secondary | ICD-10-CM

## 2018-10-23 ENCOUNTER — Encounter (INDEPENDENT_AMBULATORY_CARE_PROVIDER_SITE_OTHER): Payer: BC Managed Care – PPO | Admitting: Ophthalmology

## 2018-10-23 ENCOUNTER — Other Ambulatory Visit: Payer: Self-pay

## 2018-10-23 DIAGNOSIS — I10 Essential (primary) hypertension: Secondary | ICD-10-CM

## 2018-10-23 DIAGNOSIS — E113391 Type 2 diabetes mellitus with moderate nonproliferative diabetic retinopathy without macular edema, right eye: Secondary | ICD-10-CM

## 2018-10-23 DIAGNOSIS — E113512 Type 2 diabetes mellitus with proliferative diabetic retinopathy with macular edema, left eye: Secondary | ICD-10-CM

## 2018-10-23 DIAGNOSIS — E11311 Type 2 diabetes mellitus with unspecified diabetic retinopathy with macular edema: Secondary | ICD-10-CM | POA: Diagnosis not present

## 2018-10-23 DIAGNOSIS — H2513 Age-related nuclear cataract, bilateral: Secondary | ICD-10-CM

## 2018-10-23 DIAGNOSIS — H35033 Hypertensive retinopathy, bilateral: Secondary | ICD-10-CM

## 2018-10-23 DIAGNOSIS — H43813 Vitreous degeneration, bilateral: Secondary | ICD-10-CM

## 2018-10-26 ENCOUNTER — Encounter (INDEPENDENT_AMBULATORY_CARE_PROVIDER_SITE_OTHER): Payer: Self-pay | Admitting: Ophthalmology

## 2019-08-22 ENCOUNTER — Encounter (INDEPENDENT_AMBULATORY_CARE_PROVIDER_SITE_OTHER): Payer: Self-pay | Admitting: Ophthalmology

## 2021-07-03 ENCOUNTER — Emergency Department (HOSPITAL_BASED_OUTPATIENT_CLINIC_OR_DEPARTMENT_OTHER): Payer: BC Managed Care – PPO

## 2021-07-03 ENCOUNTER — Other Ambulatory Visit: Payer: Self-pay

## 2021-07-03 ENCOUNTER — Encounter (HOSPITAL_BASED_OUTPATIENT_CLINIC_OR_DEPARTMENT_OTHER): Payer: Self-pay | Admitting: Emergency Medicine

## 2021-07-03 ENCOUNTER — Emergency Department (HOSPITAL_BASED_OUTPATIENT_CLINIC_OR_DEPARTMENT_OTHER)
Admission: EM | Admit: 2021-07-03 | Discharge: 2021-07-04 | Disposition: A | Discharge status: Home / Self Care | Payer: BC Managed Care – PPO | Attending: Emergency Medicine | Admitting: Emergency Medicine

## 2021-07-03 DIAGNOSIS — Z79899 Other long term (current) drug therapy: Secondary | ICD-10-CM | POA: Diagnosis not present

## 2021-07-03 DIAGNOSIS — Z20822 Contact with and (suspected) exposure to covid-19: Secondary | ICD-10-CM | POA: Diagnosis not present

## 2021-07-03 DIAGNOSIS — I10 Essential (primary) hypertension: Secondary | ICD-10-CM | POA: Diagnosis not present

## 2021-07-03 DIAGNOSIS — R059 Cough, unspecified: Secondary | ICD-10-CM | POA: Diagnosis present

## 2021-07-03 DIAGNOSIS — J40 Bronchitis, not specified as acute or chronic: Secondary | ICD-10-CM | POA: Diagnosis not present

## 2021-07-03 DIAGNOSIS — E119 Type 2 diabetes mellitus without complications: Secondary | ICD-10-CM | POA: Insufficient documentation

## 2021-07-03 DIAGNOSIS — Z7984 Long term (current) use of oral hypoglycemic drugs: Secondary | ICD-10-CM | POA: Insufficient documentation

## 2021-07-03 LAB — SARS CORONAVIRUS 2 BY RT PCR: SARS Coronavirus 2 by RT PCR: NEGATIVE

## 2021-07-03 MED ORDER — ALBUTEROL SULFATE HFA 108 (90 BASE) MCG/ACT IN AERS
2.0000 | INHALATION_SPRAY | RESPIRATORY_TRACT | Status: DC | PRN
  Administered 2021-07-04: 2 via RESPIRATORY_TRACT
  Filled 2021-07-03 (×2): qty 6.7

## 2021-07-03 MED ORDER — PREDNISONE 10 MG (21) PO TBPK: ORAL_TABLET | Freq: Every day | ORAL | 0 refills | Status: DC

## 2021-07-03 MED ORDER — PROMETHAZINE-DM 6.25-15 MG/5ML PO SYRP: 5.0000 mL | ORAL_SOLUTION | Freq: Four times a day (QID) | ORAL | 0 refills | Status: DC | PRN

## 2021-07-03 MED ORDER — AZITHROMYCIN 250 MG PO TABS: 250.0000 mg | ORAL_TABLET | Freq: Every day | ORAL | 0 refills | Status: DC

## 2021-07-03 NOTE — ED Triage Notes (Signed)
Cough and chest tightness starting today worse last two hours.

## 2021-07-03 NOTE — Discharge Instructions (Signed)
Please take medications as prescribed.  Please follow-up with your primary care provider.  Return to the emergency room for any new or concerning symptoms.  Drink plenty of water, take Tylenol and ibuprofen as discussed below.  Please take all of your prescribed medications.  Please use Tylenol or ibuprofen for pain.  You may use 600 mg ibuprofen every 6 hours or 1000 mg of Tylenol every 6 hours.  You may choose to alternate between the 2.  This would be most effective.  Not to exceed 4 g of Tylenol within 24 hours.  Not to exceed 3200 mg ibuprofen 24 hours.

## 2021-07-03 NOTE — ED Provider Notes (Signed)
[facility] EMERGENCY DEPARTMENT Provider Note   CSN: [id number] Arrival date & time: 07/03/21  2108     History  Chief Complaint  Patient presents with  [id number] Cough    Timothy Heath is a 46 y.o. male.  Patient with a past medical history significant for hypertension, diabetes, migraines  Patient presented to the emergency room today with complaints of cough.  He states that is mostly dry occasionally productive of some yellow mucousy sputum.  Denies any fevers at home states that he has been coughing for 2 weeks since he went on a cruise.  He states that it started with a scratchy throat and over the past 3 to 4 days he has had some sinus pressure and drainage.  No nausea or vomiting no difficulty breathing.  He states that occasionally has some pain in his chest when he is coughing but otherwise denies any pain in his chest.  He has been taking Mucinex but no other medications.  He denies any leg swelling or pain no nausea vomiting or diarrhea.  No other associate symptoms.  Currently he states he is mostly only experiencing a cough denies any chest pain or difficulty breathing currently.  Does have a history of pneumonia after hospitalization several years ago.    Home Medications Prior to Admission medications   Medication Sig Start Date End Date Taking? Authorizing Provider  azithromycin (ZITHROMAX) 250 MG tablet Take 1 tablet (250 mg total) by mouth daily. Take first 2 tablets together, then 1 every day until finished. 07/03/21  Yes Doyce Stonehouse S, PA  predniSONE (STERAPRED UNI-PAK 21 TAB) 10 MG (21) TBPK tablet Take by mouth daily. Take 6 tabs by mouth daily  for 2 days, then 5 tabs for 2 days, then 4 tabs for 2 days, then 3 tabs for 2 days, 2 tabs for 2 days, then 1 tab by mouth daily for 2 days 07/03/21  Yes Sani Loiseau S, PA  promethazine-dextromethorphan (PROMETHAZINE-DM) 6.25-15 MG/5ML syrup Take 5 mLs by mouth 4 (four) times daily as needed for cough. 07/03/21   Yes Talesha Ellithorpe S, PA  glipiZIDE (GLUCOTROL) 5 MG tablet Take 1 tablet (5 mg total) by mouth daily. 12/03/16 12/03/17  Marinda ElkFeliz Ortiz, Abraham, MD  lisinopril (ZESTRIL) 10 MG tablet Take 1 tablet (10 mg total) by mouth daily. Patient not taking: Reported on 11/29/2016 10/04/15   Berton Mountgbata, Sylvester I, MD  metFORMIN (GLUCOPHAGE) 1000 MG tablet Take 1 tablet (1,000 mg total) by mouth 2 (two) times daily with a meal. 12/03/16   Marinda ElkFeliz Ortiz, Abraham, MD  metoCLOPramide (REGLAN) 5 MG tablet Take 1 tablet (5 mg total) by mouth every 6 (six) hours as needed for nausea or vomiting. Patient not taking: Reported on 11/29/2016 10/04/15   Barnetta Chapelgbata, Sylvester I, MD  naphazoline-pheniramine (NAPHCON-A) 0.025-0.3 % ophthalmic solution Place 1 drop into the left eye every 4 (four) hours as needed for irritation. Patient not taking: Reported on 11/29/2016 06/17/16   Barrett HenleNadeau, Nicole Elizabeth, PA-C  ondansetron (ZOFRAN-ODT) 8 MG disintegrating tablet Take 1 tablet (8 mg total) by mouth every 8 (eight) hours as needed for nausea. Patient not taking: Reported on 11/29/2016 01/26/16   Trena PlattEnglish, Stephanie D, PA  SUMAtriptan (IMITREX) 50 MG tablet Take 1 tablet (50 mg total) by mouth every 2 (two) hours as needed for migraine or headache (Maximum 200mg  in one day). May repeat in 2 hours if headache persists or recurs. Patient not taking: Reported on 11/29/2016 10/04/15   Berton Mountgbata, Sylvester I,  MD      Allergies    Bee venom and Penicillins    Review of Systems   Review of Systems  Respiratory:  Positive for cough.   Cardiovascular:  Positive for chest pain.   Physical Exam Updated Vital Signs BP (!) 172/89   Pulse 92   Temp 98.8 F (37.1 C) (Oral)   Resp 19   Ht 5' 11.75" (1.822 m)   Wt 117.9 kg   SpO2 94%   BMI 35.51 kg/m  Physical Exam Vitals and nursing note reviewed.  Constitutional:      General: He is not in acute distress.    Appearance: He is obese.     Comments: Pleasant well-appearing 46 year old.  In  no acute distress.  Sitting comfortably in bed.  Able answer questions appropriately follow commands. No increased work of breathing. Speaking in full sentences.   HENT:     Head: Normocephalic and atraumatic.     Nose: Nose normal.  Eyes:     General: No scleral icterus. Cardiovascular:     Rate and Rhythm: Normal rate and regular rhythm.     Pulses: Normal pulses.     Heart sounds: Normal heart sounds.  Pulmonary:     Effort: Pulmonary effort is normal. No respiratory distress.     Breath sounds: No wheezing.     Comments: Coughs occasionally Abdominal:     Palpations: Abdomen is soft.     Tenderness: There is no abdominal tenderness. There is no guarding or rebound.  Musculoskeletal:     Cervical back: Normal range of motion.     Right lower leg: No edema.     Left lower leg: No edema.     Comments: No lower extremity edema or calf tenderness  Skin:    General: Skin is warm and dry.     Capillary Refill: Capillary refill takes less than 2 seconds.  Neurological:     Mental Status: He is alert. Mental status is at baseline.  Psychiatric:        Mood and Affect: Mood normal.        Behavior: Behavior normal.    ED Results / Procedures / Treatments   Labs (all labs ordered are listed, but only abnormal results are displayed) Labs Reviewed  SARS CORONAVIRUS 2 BY RT PCR    EKG EKG Interpretation  Date/Time:  Friday Jul 03 2021 21:25:45 EDT Ventricular Rate:  92 PR Interval:  144 QRS Duration: 82 QT Interval:  344 QTC Calculation: 425 R Axis:   52 Text Interpretation: Normal sinus rhythm Normal ECG When compared with ECG of 03-Dec-2016 23:09, PREVIOUS ECG IS PRESENT No significant change was found Confirmed by Glynn Octave [id number]) on 07/03/2021 9:52:16 PM  Radiology DG Chest 2 View  Result Date: 07/03/2021 CLINICAL DATA:  Cough, chest tightness, low-grade fever EXAM: CHEST - 2 VIEW COMPARISON:  12/03/2016 FINDINGS: Frontal and lateral views of the chest  demonstrate an unremarkable cardiac silhouette. There is patchy bilateral ground-glass airspace disease, most pronounced within the right upper and left lower lung zones. No effusion or pneumothorax. No acute bony abnormalities. IMPRESSION: 1. Bilateral multifocal ground-glass airspace disease, compatible with multifocal pneumonia such as COVID. Electronically Signed   By: Sharlet Salina M.D.   On: 07/03/2021 21:46    Procedures Procedures    Medications Ordered in ED Medications  albuterol (VENTOLIN HFA) 108 (90 Base) MCG/ACT inhaler 2 puff (has no administration in time range)    ED Course/ Medical Decision  Making/ A&P Clinical Course as of 07/03/21 2352  Fri Jul 03, 2021  2311 On a cruise and started coughing two weeks ago.  Started with scratchy throat. No fevers at home. Last 3-4 days noticed some sinus drainage.  [WF]    Clinical Course User Index [WF] Gailen Shelter, [place]                           Medical Decision Making Amount and/or Complexity of Data Reviewed Radiology: ordered.  Risk Prescription drug management.    This patient presents to the ED for concern of cough, this involves a number of treatment options, and is a complaint that carries with it a moderate risk of complications and morbidity.  The differential diagnosis includes Differential diagnosis for emergent cause of cough includes but is not limited to upper respiratory infection, lower respiratory infection, allergies, asthma, irritants, foreign body, medications such as ACE inhibitors, reflux, asthma, CHF, lung cancer, interstitial lung disease, psychiatric causes, postnasal drip and postinfectious bronchospasm.    Co morbidities: Discussed in HPI   Brief History:  Patient with a past medical history significant for hypertension, diabetes, migraines  Patient presented to the emergency room today with complaints of cough.  He states that is mostly dry occasionally productive of some yellow mucousy  sputum.  Denies any fevers at home states that he has been coughing for 2 weeks since he went on a cruise.  He states that it started with a scratchy throat and over the past 3 to 4 days he has had some sinus pressure and drainage.  No nausea or vomiting no difficulty breathing.  He states that occasionally has some pain in his chest when he is coughing but otherwise denies any pain in his chest.  He has been taking Mucinex but no other medications.  He denies any leg swelling or pain no nausea vomiting or diarrhea.  No other associate symptoms.  Currently he states he is mostly only experiencing a cough denies any chest pain or difficulty breathing currently.  Does have a history of pneumonia after hospitalization several years ago.  Patient is a paramedic.  With a lengthy discussion about his symptoms and he feels that his chest pain is only when he is coughing.  I offered cardiac work-up and more extensive evaluation which she declines.  Physical exam is relatively unremarkable.  Patient coughs occasionally.  No wheezing or abnormal lung sounds apart from perhaps some mildly coarse lung sounds.   EMR reviewed including pt PMHx, past surgical history and past visits to ER.   See HPI for more details   Lab Tests:   COVID test is pending at this time.   Imaging Studies:  Abnormal findings. I personally reviewed all imaging studies. Imaging notable for Bilateral multifocal ground glass opacities.    Cardiac Monitoring:  EKG non-ischemic reviewed by Dr. Manus Gunning.    Medicines ordered:  I ordered medication including albuterol  for cough Reevaluation of the patient after these medicines showed that the patient stayed the same I have reviewed the patients home medicines and have made adjustments as needed   Critical Interventions:     Consults/Attending Physician      Reevaluation:  After the interventions noted above I re-evaluated patient and found that they have :  stayed the same.    Social Determinants of Health:      Problem List / ED Course:  Cough ongoing for approximately 2 weeks started  after a cruise.  Patient has had COVID in the past states that he feels much better than he has with COVID in the past.  No fevers at home.  COVID test negative just prior to discharge.  I personally reviewed chest x-ray images and agree with radiology read.  We will treat with azithromycin and recommend close follow-up with PCP.  Return precautions given.  Patient ambulated without desaturation.  Will discharge home with prednisone and Promethazine DM.   Dispostion:  After consideration of the diagnostic results and the patients response to treatment, I feel that the patent would benefit from DC w close FU.     Final Clinical Impression(s) / ED Diagnoses Final diagnoses:  Bronchitis  Hypertension, unspecified type    Rx / DC Orders ED Discharge Orders          Ordered    azithromycin (ZITHROMAX) 250 MG tablet  Daily        07/03/21 2318    promethazine-dextromethorphan (PROMETHAZINE-DM) 6.25-15 MG/5ML syrup  4 times daily PRN        07/03/21 2318    predniSONE (STERAPRED UNI-PAK 21 TAB) 10 MG (21) TBPK tablet  Daily        07/03/21 2318              Gailen Shelter, [place] 07/03/21 2352    Paula Libra, MD 07/04/21 0221

## 2021-07-04 NOTE — ED Notes (Addendum)
Ambulated with pulse ox: SpO2 100-97 % Heart Rate:90-97

## 2021-07-04 NOTE — ED Notes (Signed)
Pt A&OX4 ambulatory at d/c with independent steady gait. Pt verbalized understanding of d/c instructions, prescriptions and follow up care. 

## 2021-09-15 ENCOUNTER — Encounter (INDEPENDENT_AMBULATORY_CARE_PROVIDER_SITE_OTHER): Payer: BC Managed Care – PPO | Admitting: Ophthalmology

## 2022-05-14 ENCOUNTER — Other Ambulatory Visit (HOSPITAL_COMMUNITY): Payer: Self-pay

## 2022-05-14 MED ORDER — MOUNJARO 7.5 MG/0.5ML ~~LOC~~ SOAJ
7.5000 mg | SUBCUTANEOUS | 0 refills | Status: AC
  Filled 2022-05-14: qty 2, 28d supply, fill #0

## 2022-05-15 ENCOUNTER — Other Ambulatory Visit (HOSPITAL_COMMUNITY): Payer: Self-pay

## 2022-05-19 ENCOUNTER — Other Ambulatory Visit (HOSPITAL_COMMUNITY): Payer: Self-pay

## 2022-05-21 ENCOUNTER — Other Ambulatory Visit (HOSPITAL_COMMUNITY): Payer: Self-pay

## 2022-07-22 ENCOUNTER — Ambulatory Visit
Admission: EM | Admit: 2022-07-22 | Discharge: 2022-07-22 | Disposition: A | Discharge status: Home / Self Care | Payer: Commercial Managed Care - PPO | Attending: Family Medicine | Admitting: Family Medicine

## 2022-07-22 ENCOUNTER — Encounter: Payer: Self-pay | Admitting: *Deleted

## 2022-07-22 ENCOUNTER — Other Ambulatory Visit: Payer: Self-pay

## 2022-07-22 DIAGNOSIS — Z1152 Encounter for screening for COVID-19: Secondary | ICD-10-CM | POA: Insufficient documentation

## 2022-07-22 DIAGNOSIS — B349 Viral infection, unspecified: Secondary | ICD-10-CM | POA: Diagnosis present

## 2022-07-22 MED ORDER — ACETAMINOPHEN 325 MG PO TABS
975.0000 mg | ORAL_TABLET | Freq: Once | ORAL | Status: AC
  Administered 2022-07-22: 975 mg via ORAL

## 2022-07-22 MED ORDER — IBUPROFEN 800 MG PO TABS
800.0000 mg | ORAL_TABLET | Freq: Once | ORAL | Status: AC
  Administered 2022-07-22: 800 mg via ORAL

## 2022-07-22 MED ORDER — PROMETHAZINE-DM 6.25-15 MG/5ML PO SYRP: 5.0000 mL | ORAL_SOLUTION | Freq: Four times a day (QID) | ORAL | 0 refills | Status: AC | PRN

## 2022-07-22 NOTE — Discharge Instructions (Addendum)
The flu test is negative.   You have been swabbed for COVID, and the test will result in the next 24 hours. Our staff will call you if positive. If the COVID test is positive, you should quarantine until you are fever free for 24 hours and you are starting to feel better, and then take added precautions for the next 5 days, such as physical distancing/wearing a mask and good hand hygiene/washing.  Take Phenergan with dextromethorphan syrup--5 mL or 1 teaspoon every 6 hours as needed for cough  Your kidney function is reduced on lab work I found an [facility] record.  You should not take ibuprofen, naproxen, or any other anti-inflammatories on a regular basis.  If you worsen in any way including just getting weaker, then please proceed to the emergency room for further evaluation

## 2022-07-22 NOTE — ED Triage Notes (Signed)
Pt reports he has felt nauseated, having general body aches, HA and congested cough. This started on Tuesday.

## 2022-07-22 NOTE — ED Provider Notes (Signed)
[facility]    CSN: [id number] Arrival date & time: 07/22/22  1705      History   Chief Complaint Chief Complaint  Patient presents with   Fever   Nausea   Headache    HPI Timothy Heath is a 47 y.o. male.    Fever Associated symptoms: headaches   Headache Associated symptoms: fever    Here for malaise and some headache and cough and congestion.  He is felt very tired.  Symptoms began on the evening of June 11.  He has had some fever and here it is 102.4. He felt nauseated once but that is improved  No diarrhea Past medical history significant for diabetes and hypertension.  Sugars been doing well lately      Past Medical History:  Diagnosis Date   Allergy    Asthma    Diabetes mellitus    Hypertension    Noncompliance     Patient Active Problem List   Diagnosis Date Noted   Diabetic foot ulcer associated with type 2 diabetes mellitus (HCC) 12/01/2016   Cellulitis of left foot 11/30/2016   Nausea and vomiting 10/03/2015   Type 2 diabetes mellitus with stage 3 chronic kidney disease (HCC) 10/31/2006   HYPERTENSION 10/31/2006   ASTHMA 10/31/2006    Past Surgical History:  Procedure Laterality Date   APPENDECTOMY     FRACTURE SURGERY     HERNIA REPAIR     KNEE SURGERY         Home Medications    Prior to Admission medications   Medication Sig Start Date End Date Taking? Authorizing Provider  glipiZIDE (GLUCOTROL) 5 MG tablet Take 1 tablet (5 mg total) by mouth daily. 12/03/16 07/22/22 Yes Marinda Elk, MD  hydrochlorothiazide (HYDRODIURIL) 12.5 MG tablet Take 12.5 mg by mouth daily. PT does not know dose   Yes [provider]  promethazine-dextromethorphan (PROMETHAZINE-DM) 6.25-15 MG/5ML syrup Take 5 mLs by mouth 4 (four) times daily as needed for cough. 07/22/22  Yes Zenia Resides, MD  rosuvastatin (CRESTOR) 5 MG tablet Take 5 mg by mouth daily. Dose unknown by PT   Yes [provider]  tirzepatide  [facility]) 7.5 MG/0.5ML Pen Inject 7.5 mg into the skin once a week. 05/05/22  Yes     Family History Family History  Problem Relation Age of Onset   Diabetes Mother    Hypertension Mother    Kidney disease Father    Hypertension Father    Diabetes Maternal Grandmother    Hypertension Maternal Grandmother    Stroke Maternal Grandmother    Kidney disease Maternal Grandmother    Depression Brother     Social History Social History   Tobacco Use   Smoking status: Never   Smokeless tobacco: Never  Substance Use Topics   Alcohol use: No   Drug use: No     Allergies   Bee venom and Penicillins   Review of Systems Review of Systems  Constitutional:  Positive for fever.  Neurological:  Positive for headaches.     Physical Exam Triage Vital Signs ED Triage Vitals  Enc Vitals Group     BP 07/22/22 1808 98/60     Pulse Rate 07/22/22 1808 98     Resp 07/22/22 1808 20     Temp 07/22/22 1808 (!) 102.4 F (39.1 C)     Temp src --      SpO2 07/22/22 1808 95 %     Weight --  Height --      Head Circumference --      Peak Flow --      Pain Score 07/22/22 1801 2     Pain Loc --      Pain Edu? --      Excl. in GC? --    No data found.  Updated Vital Signs BP 98/60   Pulse 98   Temp (!) 102.4 F (39.1 C)   Resp 20   SpO2 95%   Visual Acuity Right Eye Distance:   Left Eye Distance:   Bilateral Distance:    Right Eye Near:   Left Eye Near:    Bilateral Near:     Physical Exam Vitals reviewed.  Constitutional:      General: He is not in acute distress.    Appearance: He is not ill-appearing, toxic-appearing or diaphoretic.     Comments: His blood pressure is 98/60 and temperature here is 102.4  HENT:     Nose: Congestion present.     Mouth/Throat:     Mouth: Mucous membranes are moist.     Pharynx: No oropharyngeal exudate or posterior oropharyngeal erythema.  Eyes:     Extraocular Movements: Extraocular movements intact.     Conjunctiva/sclera:  Conjunctivae normal.     Pupils: Pupils are equal, round, and reactive to light.  Cardiovascular:     Rate and Rhythm: Normal rate and regular rhythm.     Heart sounds: No murmur heard. Pulmonary:     Effort: Pulmonary effort is normal. No respiratory distress.     Breath sounds: Normal breath sounds. No stridor. No wheezing, rhonchi or rales.  Skin:    Capillary Refill: Capillary refill takes less than 2 seconds.     Coloration: Skin is not pale.  Neurological:     General: No focal deficit present.     Mental Status: He is alert and oriented to person, place, and time.  Psychiatric:        Behavior: Behavior normal.      UC Treatments / Results  Labs (all labs ordered are listed, but only abnormal results are displayed) Labs Reviewed  SARS CORONAVIRUS 2 (TAT 6-24 HRS)  POCT INFLUENZA A/B    EKG   Radiology No results found.  Procedures Procedures (including critical care time)  Medications Ordered in UC Medications  ibuprofen (ADVIL) tablet 800 mg (800 mg Oral Given 07/22/22 1816)  acetaminophen (TYLENOL) tablet 975 mg (975 mg Oral Given 07/22/22 1816)    Initial Impression / Assessment and Plan / UC Course  I have reviewed the triage vital signs and the nursing notes.  Pertinent labs & imaging results that were available during my care of the patient were reviewed by me and considered in my medical decision making (see chart for details).        Flu test is negative.  COVID swab is done today.  And if positive he should have Paxlovid and renal dosing.  On review of the chart which occurred after he was given ibuprofen for his fever, it was discovered that his creatinine was 2.1 and his EGFR was in the 40s in February of this year in care everywhere.  Advised the patient should only take Tylenol for pain or fever.  Phenergan with dextromethorphan is sent in for cough.  If he begins to feel weaker he should proceed to the emergency room for further  evaluation Final Clinical Impressions(s) / UC Diagnoses   Final diagnoses:  Viral syndrome  Discharge Instructions      The flu test is negative.   You have been swabbed for COVID, and the test will result in the next 24 hours. Our staff will call you if positive. If the COVID test is positive, you should quarantine until you are fever free for 24 hours and you are starting to feel better, and then take added precautions for the next 5 days, such as physical distancing/wearing a mask and good hand hygiene/washing.  Take Phenergan with dextromethorphan syrup--5 mL or 1 teaspoon every 6 hours as needed for cough  Your kidney function is reduced on lab work I found an [facility] record.  You should not take ibuprofen, naproxen, or any other anti-inflammatories on a regular basis.  If you worsen in any way including just getting weaker, then please proceed to the emergency room for further evaluation       ED Prescriptions     Medication Sig Dispense Auth. Provider   promethazine-dextromethorphan (PROMETHAZINE-DM) 6.25-15 MG/5ML syrup Take 5 mLs by mouth 4 (four) times daily as needed for cough. 118 mL Zenia Resides, MD      PDMP not reviewed this encounter.   Zenia Resides, MD 07/22/22 [id number]

## 2022-07-23 LAB — SARS CORONAVIRUS 2 (TAT 6-24 HRS): SARS Coronavirus 2: NEGATIVE

## 2022-07-24 ENCOUNTER — Other Ambulatory Visit: Payer: Self-pay

## 2022-07-24 ENCOUNTER — Emergency Department (HOSPITAL_BASED_OUTPATIENT_CLINIC_OR_DEPARTMENT_OTHER): Payer: Commercial Managed Care - PPO

## 2022-07-24 ENCOUNTER — Emergency Department (HOSPITAL_BASED_OUTPATIENT_CLINIC_OR_DEPARTMENT_OTHER)
Admission: EM | Admit: 2022-07-24 | Discharge: 2022-07-24 | Disposition: A | Discharge status: Home / Self Care | Payer: Commercial Managed Care - PPO | Attending: Emergency Medicine | Admitting: Emergency Medicine

## 2022-07-24 ENCOUNTER — Encounter (HOSPITAL_BASED_OUTPATIENT_CLINIC_OR_DEPARTMENT_OTHER): Payer: Self-pay | Admitting: Emergency Medicine

## 2022-07-24 DIAGNOSIS — N179 Acute kidney failure, unspecified: Secondary | ICD-10-CM | POA: Insufficient documentation

## 2022-07-24 DIAGNOSIS — N189 Chronic kidney disease, unspecified: Secondary | ICD-10-CM | POA: Insufficient documentation

## 2022-07-24 DIAGNOSIS — I129 Hypertensive chronic kidney disease with stage 1 through stage 4 chronic kidney disease, or unspecified chronic kidney disease: Secondary | ICD-10-CM | POA: Diagnosis not present

## 2022-07-24 DIAGNOSIS — R682 Dry mouth, unspecified: Secondary | ICD-10-CM | POA: Diagnosis not present

## 2022-07-24 DIAGNOSIS — E1122 Type 2 diabetes mellitus with diabetic chronic kidney disease: Secondary | ICD-10-CM | POA: Diagnosis not present

## 2022-07-24 DIAGNOSIS — Z1152 Encounter for screening for COVID-19: Secondary | ICD-10-CM | POA: Diagnosis not present

## 2022-07-24 DIAGNOSIS — N3000 Acute cystitis without hematuria: Secondary | ICD-10-CM | POA: Diagnosis not present

## 2022-07-24 DIAGNOSIS — R509 Fever, unspecified: Secondary | ICD-10-CM | POA: Diagnosis present

## 2022-07-24 DIAGNOSIS — R739 Hyperglycemia, unspecified: Secondary | ICD-10-CM

## 2022-07-24 LAB — RESPIRATORY PANEL BY PCR

## 2022-07-24 LAB — URINALYSIS, ROUTINE W REFLEX MICROSCOPIC
Bilirubin Urine: NEGATIVE
Glucose, UA: >=500 mg/dL — AB
Ketones, ur: NEGATIVE mg/dL
Leukocytes,Ua: NEGATIVE
Nitrite: NEGATIVE
Protein, ur: 100 mg/dL — AB
Specific Gravity, Urine: 1.015 (ref 1.005–1.030)
pH: 5.5 (ref 5.0–8.0)

## 2022-07-24 LAB — BASIC METABOLIC PANEL
Anion gap: 11 (ref 5–15)
BUN: 34 mg/dL — ABNORMAL HIGH (ref 6–20)
CO2: 23 mmol/L (ref 22–32)
Calcium: 9 mg/dL (ref 8.9–10.3)
Chloride: 94 mmol/L — ABNORMAL LOW (ref 98–111)
Creatinine, Ser: 2.61 mg/dL — ABNORMAL HIGH (ref 0.61–1.24)
GFR, Estimated: 30 mL/min — ABNORMAL LOW (ref >60)
Glucose, Bld: 495 mg/dL — ABNORMAL HIGH (ref 70–99)
Potassium: 4.6 mmol/L (ref 3.5–5.1)
Sodium: 128 mmol/L — ABNORMAL LOW (ref 135–145)

## 2022-07-24 LAB — CBG MONITORING, ED
Glucose-Capillary: 432 mg/dL — ABNORMAL HIGH (ref 70–99)
Glucose-Capillary: 308 mg/dL — ABNORMAL HIGH (ref 70–99)

## 2022-07-24 LAB — CBC WITH DIFFERENTIAL/PLATELET
Abs Immature Granulocytes: 0.07 10*3/uL (ref 0.00–0.07)
Basophils Absolute: 0 10*3/uL (ref 0.0–0.1)
Basophils Relative: 0 %
Eosinophils Absolute: 0.1 10*3/uL (ref 0.0–0.5)
Eosinophils Relative: 0 %
HCT: 34.1 % — ABNORMAL LOW (ref 39.0–52.0)
Hemoglobin: 11.5 g/dL — ABNORMAL LOW (ref 13.0–17.0)
Immature Granulocytes: 0 %
Lymphocytes Relative: 9 %
Lymphs Abs: 1.4 10*3/uL (ref 0.7–4.0)
MCH: 26.3 pg (ref 26.0–34.0)
MCHC: 33.7 g/dL (ref 30.0–36.0)
MCV: 77.9 fL — ABNORMAL LOW (ref 80.0–100.0)
Monocytes Absolute: 1.7 10*3/uL — ABNORMAL HIGH (ref 0.1–1.0)
Monocytes Relative: 10 %
Neutro Abs: 13.1 10*3/uL — ABNORMAL HIGH (ref 1.7–7.7)
Neutrophils Relative %: 81 %
Platelets: 264 10*3/uL (ref 150–400)
RBC: 4.38 MIL/uL (ref 4.22–5.81)
RDW: 13.1 % (ref 11.5–15.5)
WBC: 16.4 10*3/uL — ABNORMAL HIGH (ref 4.0–10.5)
nRBC: 0 % (ref 0.0–0.2)

## 2022-07-24 LAB — URINALYSIS, MICROSCOPIC (REFLEX)

## 2022-07-24 LAB — LACTIC ACID, PLASMA: Lactic Acid, Venous: 1.5 mmol/L (ref 0.5–1.9)

## 2022-07-24 LAB — SARS CORONAVIRUS 2 BY RT PCR: SARS Coronavirus 2 by RT PCR: NEGATIVE

## 2022-07-24 MED ORDER — ACETAMINOPHEN 500 MG PO TABS
1000.0000 mg | ORAL_TABLET | Freq: Once | ORAL | Status: AC
  Administered 2022-07-24: 1000 mg via ORAL
  Filled 2022-07-24: qty 2

## 2022-07-24 MED ORDER — SODIUM CHLORIDE 0.9 % IV BOLUS
1000.0000 mL | Freq: Once | INTRAVENOUS | Status: AC
  Administered 2022-07-24: 1000 mL via INTRAVENOUS

## 2022-07-24 MED ORDER — INSULIN ASPART 100 UNIT/ML IJ SOLN
10.0000 [IU] | Freq: Once | INTRAMUSCULAR | Status: AC
  Administered 2022-07-24: 10 [IU] via SUBCUTANEOUS

## 2022-07-24 MED ORDER — DOXYCYCLINE HYCLATE 100 MG PO CAPS: 100.0000 mg | ORAL_CAPSULE | Freq: Two times a day (BID) | ORAL | 0 refills | Status: DC

## 2022-07-24 MED ORDER — SODIUM CHLORIDE 0.9 % IV SOLN
1.0000 g | Freq: Once | INTRAVENOUS | Status: AC
  Administered 2022-07-24: 1 g via INTRAVENOUS
  Filled 2022-07-24: qty 10

## 2022-07-24 NOTE — ED Provider Notes (Signed)
[place] [facility] Provider Note   CSN: [id number] Arrival date & time: 07/24/22  1515     History  Chief Complaint  Patient presents with   URI    CARVELL LAMBERG is a 47 y.o. male history of diabetes, hypertension, CKD here presenting with persistent fevers and dehydration.  Patient states that he has been running daily fevers for the last 4 days.  He states that usually is 10 1-1 02 at home.  Patient went to urgent care 2 days ago and had lab work that showed creatinine of 2.1.  His baseline is around 2.1 at that time.  He also tested negative for COVID and flu.  He was prescribed cough medicine.  He states that he is running persistent fever still and has poor appetite.  Denies any vomiting but has not eaten much.  He states that he is feels very lightheaded and dizzy when he ever he stands up.  The history is provided by the patient.       Home Medications Prior to Admission medications   Medication Sig Start Date End Date Taking? Authorizing Provider  glipiZIDE (GLUCOTROL) 5 MG tablet Take 1 tablet (5 mg total) by mouth daily. 12/03/16 07/22/22  Marinda Elk, MD  hydrochlorothiazide (HYDRODIURIL) 12.5 MG tablet Take 12.5 mg by mouth daily. PT does not know dose    [provider]  promethazine-dextromethorphan (PROMETHAZINE-DM) 6.25-15 MG/5ML syrup Take 5 mLs by mouth 4 (four) times daily as needed for cough. 07/22/22   Zenia Resides, MD  rosuvastatin (CRESTOR) 5 MG tablet Take 5 mg by mouth daily. Dose unknown by PT    [provider]  tirzepatide [facility]) 7.5 MG/0.5ML Pen Inject 7.5 mg into the skin once a week. 05/05/22         Allergies    Bee venom and Penicillins    Review of Systems   Review of Systems  Neurological:  Positive for dizziness.  All other systems reviewed and are negative.   Physical Exam Updated Vital Signs BP 126/77 (BP Location: Right Arm)   Pulse 98   Temp 99.6 F (37.6  C) (Oral)   Resp 19   Ht 6' (1.829 m)   Wt 117.9 kg   SpO2 98%   BMI 35.26 kg/m  Physical Exam Vitals and nursing note reviewed.  Constitutional:      Comments: Dehydrated  HENT:     Head: Normocephalic.     Nose: Nose normal.     Mouth/Throat:     Mouth: Mucous membranes are dry.  Eyes:     Extraocular Movements: Extraocular movements intact.     Pupils: Pupils are equal, round, and reactive to light.  Cardiovascular:     Rate and Rhythm: Normal rate and regular rhythm.     Pulses: Normal pulses.     Heart sounds: Normal heart sounds.  Pulmonary:     Effort: Pulmonary effort is normal.     Breath sounds: Normal breath sounds.  Abdominal:     General: Abdomen is flat.     Palpations: Abdomen is soft.  Musculoskeletal:        General: Normal range of motion.     Cervical back: Normal range of motion and neck supple.  Skin:    General: Skin is warm.     Capillary Refill: Capillary refill takes less than 2 seconds.  Neurological:     General: No focal deficit present.  Mental Status: He is oriented to person, place, and time.  Psychiatric:        Mood and Affect: Mood normal.        Behavior: Behavior normal.     ED Results / Procedures / Treatments   Labs (all labs ordered are listed, but only abnormal results are displayed) Labs Reviewed  CBC WITH DIFFERENTIAL/PLATELET - Abnormal; Notable for the following components:      Result Value   WBC 16.4 (*)    Hemoglobin 11.5 (*)    HCT 34.1 (*)    MCV 77.9 (*)    Neutro Abs 13.1 (*)    Monocytes Absolute 1.7 (*)    All other components within normal limits  BASIC METABOLIC PANEL - Abnormal; Notable for the following components:   Sodium 128 (*)    Chloride 94 (*)    Glucose, Bld 495 (*)    BUN 34 (*)    Creatinine, Ser 2.61 (*)    GFR, Estimated 30 (*)    All other components within normal limits  SARS CORONAVIRUS 2 BY RT PCR  RESPIRATORY PANEL BY PCR  URINALYSIS, ROUTINE W REFLEX MICROSCOPIC     EKG EKG Interpretation  Date/Time:  Saturday July 24 2022 15:24:40 EDT Ventricular Rate:  100 PR Interval:  128 QRS Duration: 81 QT Interval:  315 QTC Calculation: 407 R Axis:   73 Text Interpretation: Sinus tachycardia ST elev, probable normal early repol pattern Baseline wander in lead(s) V5 No significant change since last tracing Confirmed by Richardean Canal [phone]) on 07/24/2022 4:46:56 PM  Radiology DG Chest 2 View  Result Date: 07/24/2022 CLINICAL DATA:  Cough and fever for 3 days.  Shortness of breath. EXAM: CHEST - 2 VIEW COMPARISON:  07/03/2021 FINDINGS: The heart size and mediastinal contours are within normal limits. Both lungs are clear. The visualized skeletal structures are unremarkable. IMPRESSION: No active cardiopulmonary disease. Electronically Signed   By: Danae Orleans M.D.   On: 07/24/2022 16:02    Procedures Procedures    Medications Ordered in ED Medications  sodium chloride 0.9 % bolus 1,000 mL (has no administration in time range)  insulin aspart (novoLOG) injection 10 Units (has no administration in time range)    ED Course/ Medical Decision Making/ A&P                             Medical Decision Making SAMMEY FREE is a 47 y.o. male here presenting with persistent fever.  Fever for about 4 days.  Patient appears dehydrated.  I still think patient likely has a viral syndrome.  Also consider pneumonia versus UTI versus pyelonephritis.  Plan to get CBC and BMP and UA and chest x-ray and reswab for COVID and flu.  Will hydrate and reassess  8:00 PM I reviewed patient's labs and independently interpreted imaging studies.  Patient became febrile 103.  White blood cell count is 16.  His white blood cell count is normally between 15 and 20.  Patient has a creatinine of 2.6.  On Care Everywhere his creatinine was 2.1 back in February.  CT did not show any hydronephrosis or kidney stone.  Chest x-ray is clear.  Patient had a possible UTI on urinalysis.   Given fever and some leukocytosis, patient was given Rocephin.  Since he has acute on chronic renal failure, patient will be discharged home with doxycycline.  Of note, patient's initial blood sugar was 495 on  the chemistry and his gap is normal.  After 2 L normal saline bolus and insulin, his glucose came down to 308.  I gave him strict return precautions  Problems Addressed: Acute cystitis without hematuria: acute illness or injury AKI (acute kidney injury) (HCC): acute illness or injury  Amount and/or Complexity of Data Reviewed Labs: ordered. Decision-making details documented in ED Course. Radiology: ordered and independent interpretation performed. Decision-making details documented in ED Course.  Risk OTC drugs. Prescription drug management.   Final Clinical Impression(s) / ED Diagnoses Final diagnoses:  None    Rx / DC Orders ED Discharge Orders     None         Charlynne Pander, MD 07/24/22 2010

## 2022-07-24 NOTE — ED Triage Notes (Signed)
Cough, fever, chills, since Wednesday.  Pt states he felt weak and lightheaded when he stood up.  Also c/o sob.  No acute respiratory distress noted.  Lungs clear, but diminished in bases.  Pt seen for same thursday at urgent care.  Was tested for flu and covid.

## 2022-07-24 NOTE — Discharge Instructions (Signed)
You have a urinary tract infection that is likely causing your fever.  You need to take doxycycline twice a day for a week  Take Tylenol as needed for fever  Your kidney function is slightly abnormal still please stay hydrated.  You need to repeat kidney function with your doctor next week  You also need to follow-up with your doctor in early next week  Return to ER if you have persistent fever, vomiting, severe abdominal pain

## 2022-07-24 NOTE — ED Notes (Signed)
1st blood cultures left hand  2 blood cultures left FA

## 2022-07-25 ENCOUNTER — Encounter (HOSPITAL_BASED_OUTPATIENT_CLINIC_OR_DEPARTMENT_OTHER): Payer: Self-pay

## 2022-07-25 ENCOUNTER — Emergency Department (HOSPITAL_BASED_OUTPATIENT_CLINIC_OR_DEPARTMENT_OTHER)
Admission: EM | Admit: 2022-07-25 | Discharge: 2022-07-25 | Disposition: A | Discharge status: Home / Self Care | Payer: Medicaid Other | Attending: Emergency Medicine | Admitting: Emergency Medicine

## 2022-07-25 ENCOUNTER — Emergency Department (HOSPITAL_BASED_OUTPATIENT_CLINIC_OR_DEPARTMENT_OTHER): Payer: Medicaid Other

## 2022-07-25 ENCOUNTER — Other Ambulatory Visit: Payer: Self-pay

## 2022-07-25 DIAGNOSIS — E11621 Type 2 diabetes mellitus with foot ulcer: Secondary | ICD-10-CM | POA: Diagnosis not present

## 2022-07-25 DIAGNOSIS — E11628 Type 2 diabetes mellitus with other skin complications: Secondary | ICD-10-CM

## 2022-07-25 DIAGNOSIS — L97529 Non-pressure chronic ulcer of other part of left foot with unspecified severity: Secondary | ICD-10-CM | POA: Insufficient documentation

## 2022-07-25 DIAGNOSIS — Z7984 Long term (current) use of oral hypoglycemic drugs: Secondary | ICD-10-CM | POA: Insufficient documentation

## 2022-07-25 DIAGNOSIS — A419 Sepsis, unspecified organism: Secondary | ICD-10-CM | POA: Diagnosis not present

## 2022-07-25 DIAGNOSIS — Z794 Long term (current) use of insulin: Secondary | ICD-10-CM | POA: Diagnosis not present

## 2022-07-25 LAB — COMPREHENSIVE METABOLIC PANEL
ALT: 15 U/L (ref 0–44)
AST: 17 U/L (ref 15–41)
Albumin: 2.9 g/dL — ABNORMAL LOW (ref 3.5–5.0)
Alkaline Phosphatase: 83 U/L (ref 38–126)
Anion gap: 10 (ref 5–15)
BUN: 35 mg/dL — ABNORMAL HIGH (ref 6–20)
CO2: 22 mmol/L (ref 22–32)
Calcium: 8.1 mg/dL — ABNORMAL LOW (ref 8.9–10.3)
Chloride: 94 mmol/L — ABNORMAL LOW (ref 98–111)
Creatinine, Ser: 2.45 mg/dL — ABNORMAL HIGH (ref 0.61–1.24)
GFR, Estimated: 32 mL/min — ABNORMAL LOW (ref >60)
Glucose, Bld: 423 mg/dL — ABNORMAL HIGH (ref 70–99)
Potassium: 4.4 mmol/L (ref 3.5–5.1)
Sodium: 126 mmol/L — ABNORMAL LOW (ref 135–145)
Total Bilirubin: 1.7 mg/dL — ABNORMAL HIGH (ref 0.3–1.2)
Total Protein: 7.3 g/dL (ref 6.5–8.1)

## 2022-07-25 LAB — CBC WITH DIFFERENTIAL/PLATELET
Abs Immature Granulocytes: 0.11 10*3/uL — ABNORMAL HIGH (ref 0.00–0.07)
Basophils Absolute: 0.1 10*3/uL (ref 0.0–0.1)
Basophils Relative: 0 %
Eosinophils Absolute: 0.1 10*3/uL (ref 0.0–0.5)
Eosinophils Relative: 1 %
HCT: 35.1 % — ABNORMAL LOW (ref 39.0–52.0)
Hemoglobin: 11.6 g/dL — ABNORMAL LOW (ref 13.0–17.0)
Immature Granulocytes: 1 %
Lymphocytes Relative: 10 %
Lymphs Abs: 2 10*3/uL (ref 0.7–4.0)
MCH: 25.8 pg — ABNORMAL LOW (ref 26.0–34.0)
MCHC: 33 g/dL (ref 30.0–36.0)
MCV: 78.2 fL — ABNORMAL LOW (ref 80.0–100.0)
Monocytes Absolute: 2.1 10*3/uL — ABNORMAL HIGH (ref 0.1–1.0)
Monocytes Relative: 11 %
Neutro Abs: 15.2 10*3/uL — ABNORMAL HIGH (ref 1.7–7.7)
Neutrophils Relative %: 77 %
Platelets: 294 10*3/uL (ref 150–400)
RBC: 4.49 MIL/uL (ref 4.22–5.81)
RDW: 13.2 % (ref 11.5–15.5)
WBC: 19.5 10*3/uL — ABNORMAL HIGH (ref 4.0–10.5)
nRBC: 0 % (ref 0.0–0.2)

## 2022-07-25 LAB — LACTIC ACID, PLASMA: Lactic Acid, Venous: 1.8 mmol/L (ref 0.5–1.9)

## 2022-07-25 LAB — CULTURE, BLOOD (ROUTINE X 2): Special Requests: ADEQUATE

## 2022-07-25 LAB — SEDIMENTATION RATE: Sed Rate: 75 mm/hr — ABNORMAL HIGH (ref 0–16)

## 2022-07-25 MED ORDER — VANCOMYCIN HCL IN DEXTROSE 1-5 GM/200ML-% IV SOLN
1000.0000 mg | Freq: Once | INTRAVENOUS | Status: AC
  Administered 2022-07-25: 1000 mg via INTRAVENOUS

## 2022-07-25 MED ORDER — SULFAMETHOXAZOLE-TRIMETHOPRIM 800-160 MG PO TABS: 1.0000 | ORAL_TABLET | Freq: Two times a day (BID) | ORAL | 0 refills | Status: AC

## 2022-07-25 MED ORDER — SODIUM CHLORIDE 0.9 % IV SOLN
2.0000 g | Freq: Two times a day (BID) | INTRAVENOUS | Status: DC
  Administered 2022-07-25: 2 g via INTRAVENOUS
  Filled 2022-07-25: qty 12.5

## 2022-07-25 MED ORDER — METRONIDAZOLE 500 MG/100ML IV SOLN
500.0000 mg | Freq: Two times a day (BID) | INTRAVENOUS | Status: DC
  Administered 2022-07-25: 500 mg via INTRAVENOUS
  Filled 2022-07-25: qty 100

## 2022-07-25 MED ORDER — SODIUM CHLORIDE 0.9 % IV BOLUS
1000.0000 mL | Freq: Once | INTRAVENOUS | Status: AC
  Administered 2022-07-25: 1000 mL via INTRAVENOUS

## 2022-07-25 MED ORDER — ACETAMINOPHEN 325 MG PO TABS
650.0000 mg | ORAL_TABLET | Freq: Once | ORAL | Status: AC
  Administered 2022-07-25: 650 mg via ORAL
  Filled 2022-07-25: qty 2

## 2022-07-25 MED ORDER — SODIUM CHLORIDE 0.9 % IV SOLN: INTRAVENOUS | Status: DC | PRN

## 2022-07-25 MED ORDER — INSULIN ASPART 100 UNIT/ML IJ SOLN
8.0000 [IU] | Freq: Once | INTRAMUSCULAR | Status: AC
  Administered 2022-07-25: 8 [IU] via SUBCUTANEOUS

## 2022-07-25 MED ORDER — VANCOMYCIN HCL IN DEXTROSE 1-5 GM/200ML-% IV SOLN
1000.0000 mg | INTRAVENOUS | Status: DC
  Filled 2022-07-25: qty 200

## 2022-07-25 MED ORDER — ONDANSETRON HCL 4 MG/2ML IJ SOLN
4.0000 mg | Freq: Once | INTRAMUSCULAR | Status: AC
  Administered 2022-07-25: 4 mg via INTRAVENOUS
  Filled 2022-07-25: qty 2

## 2022-07-25 MED ORDER — VANCOMYCIN HCL IN DEXTROSE 1-5 GM/200ML-% IV SOLN
1000.0000 mg | Freq: Once | INTRAVENOUS | Status: AC
  Administered 2022-07-25: 1000 mg via INTRAVENOUS
  Filled 2022-07-25: qty 200

## 2022-07-25 NOTE — ED Notes (Signed)
Blood Cultures obtained prior to ABX admin.

## 2022-07-25 NOTE — Discharge Instructions (Signed)
Please return to [facility] tomorrow.

## 2022-07-25 NOTE — ED Triage Notes (Signed)
The patient was here yesterday with a fever of 103. He stated today he noticed a wound on the bottom of his left foot that has foul smelling drainage. He stated he has had a fever today also.

## 2022-07-25 NOTE — Progress Notes (Signed)
Pt being followed by [facility] for Sepsis protocol. 

## 2022-07-25 NOTE — ED Provider Notes (Signed)
[facility] Provider Note   CSN: [id number] Arrival date & time: 07/25/22  1634     History  Chief Complaint  Patient presents with   Wound Check    JESSIEL LANGLAIS is a 47 y.o. male.  Patient here with left foot wound.  He was seen yesterday for fever.  He did notice foot wound yesterday.  Did not mention this to provider yesterday.  He ultimately had an infectious workup and was sent home with doxycycline for may be UTI.  Overall he continues with fever.  He has been taking doxycycline.  His blood sugars been elevated.  He has a history of diabetes.  Some peripheral neuropathy.  He has noticed some foul smell from this area as well.  Denies any trauma.  The history is provided by the patient.       Home Medications Prior to Admission medications   Medication Sig Start Date End Date Taking? Authorizing Provider  doxycycline (VIBRAMYCIN) 100 MG capsule Take 1 capsule (100 mg total) by mouth 2 (two) times daily. One po bid x 7 days 07/24/22   Charlynne Pander, MD  glipiZIDE (GLUCOTROL) 5 MG tablet Take 1 tablet (5 mg total) by mouth daily. 12/03/16 07/22/22  Marinda Elk, MD  hydrochlorothiazide (HYDRODIURIL) 12.5 MG tablet Take 12.5 mg by mouth daily. PT does not know dose    [provider]  promethazine-dextromethorphan (PROMETHAZINE-DM) 6.25-15 MG/5ML syrup Take 5 mLs by mouth 4 (four) times daily as needed for cough. 07/22/22   Zenia Resides, MD  rosuvastatin (CRESTOR) 5 MG tablet Take 5 mg by mouth daily. Dose unknown by PT    [provider]  tirzepatide [facility]) 7.5 MG/0.5ML Pen Inject 7.5 mg into the skin once a week. 05/05/22         Allergies    Bee venom and Penicillins    Review of Systems   Review of Systems  Physical Exam Updated Vital Signs BP (!) 164/88 (BP Location: Right Arm)   Pulse (!) 104   Temp 100.2 F (37.9 C) (Oral)   Resp 18   Ht 6' (1.829 m)   Wt 117 kg   SpO2 98%    BMI 34.98 kg/m  Physical Exam Vitals and nursing note reviewed.  Constitutional:      General: He is not in acute distress.    Appearance: He is well-developed. He is not ill-appearing.  HENT:     Head: Normocephalic and atraumatic.     Nose: Nose normal.     Mouth/Throat:     Mouth: Mucous membranes are moist.  Eyes:     Extraocular Movements: Extraocular movements intact.     Conjunctiva/sclera: Conjunctivae normal.     Pupils: Pupils are equal, round, and reactive to light.  Cardiovascular:     Rate and Rhythm: Regular rhythm. Tachycardia present.     Pulses: Normal pulses.     Heart sounds: No murmur heard. Pulmonary:     Effort: Pulmonary effort is normal. No respiratory distress.     Breath sounds: Normal breath sounds.  Abdominal:     Palpations: Abdomen is soft.     Tenderness: There is no abdominal tenderness.  Musculoskeletal:        General: No swelling.     Cervical back: Normal range of motion and neck supple.  Skin:    General: Skin is warm and dry.     Capillary Refill: Capillary refill takes less  than 2 seconds.     Comments: He has a ulcer on the bottom of the left foot at the base of the left big toe with ulceration and redness and swelling to the bottom of the left foot into the top of the left foot, there is some purulence from the site  Neurological:     Mental Status: He is alert.  Psychiatric:        Mood and Affect: Mood normal.     ED Results / Procedures / Treatments   Labs (all labs ordered are listed, but only abnormal results are displayed) Labs Reviewed  CBC WITH DIFFERENTIAL/PLATELET - Abnormal; Notable for the following components:      Result Value   WBC 19.5 (*)    Hemoglobin 11.6 (*)    HCT 35.1 (*)    MCV 78.2 (*)    MCH 25.8 (*)    Neutro Abs 15.2 (*)    Monocytes Absolute 2.1 (*)    Abs Immature Granulocytes 0.11 (*)    All other components within normal limits  COMPREHENSIVE METABOLIC PANEL - Abnormal; Notable for the  following components:   Sodium 126 (*)    Chloride 94 (*)    Glucose, Bld 423 (*)    BUN 35 (*)    Creatinine, Ser 2.45 (*)    Calcium 8.1 (*)    Albumin 2.9 (*)    Total Bilirubin 1.7 (*)    GFR, Estimated 32 (*)    All other components within normal limits  CULTURE, BLOOD (ROUTINE X 2)  CULTURE, BLOOD (ROUTINE X 2)  URINE CULTURE  LACTIC ACID, PLASMA  LACTIC ACID, PLASMA  SEDIMENTATION RATE  C-REACTIVE PROTEIN    EKG None  Radiology DG Foot Complete Left  Result Date: 07/25/2022 CLINICAL DATA:  pain, diabetic foot infection EXAM: LEFT FOOT - COMPLETE 3+ VIEW COMPARISON:  November 30, 2016 FINDINGS: No acute fracture or dislocation. Mild midfoot degenerative changes. There are periarticular erosions at the medial head of the first proximal phalanx and the medial head of the first MTP. These are new since 2018. Cortex otherwise appears preserved. No unexpected radiopaque foreign body. Vascular calcifications. IMPRESSION: There are periarticular erosions at the head of the first proximal phalanx and head of the first MTP. These are new since 2018. Findings are favored to reflect an etiology such as gout rather than osteomyelitis. Recommend correlation with foot ulcer location. Electronically Signed   By: Meda Klinefelter M.D.   On: 07/25/2022 17:19   CT ABDOMEN PELVIS WO CONTRAST  Result Date: 07/24/2022 CLINICAL DATA:  Fever and sepsis. EXAM: CT ABDOMEN AND PELVIS WITHOUT CONTRAST TECHNIQUE: Multidetector CT imaging of the abdomen and pelvis was performed following the standard protocol without IV contrast. RADIATION DOSE REDUCTION: This exam was performed according to the departmental dose-optimization program which includes automated exposure control, adjustment of the mA and/or kV according to patient size and/or use of iterative reconstruction technique. COMPARISON:  None Available. FINDINGS: Evaluation of this exam is limited in the absence of intravenous contrast. Lower chest:  The visualized lung bases are clear. No intra-abdominal free air or free fluid. Hepatobiliary: The liver is unremarkable. No biliary dilatation. Multiple gallstones. No significant pericholecystic fluid. Ultrasound may provide better evaluation if there is clinical concern for acute cholecystitis. Pancreas: Unremarkable. No pancreatic ductal dilatation or surrounding inflammatory changes. Spleen: Normal in size without focal abnormality. Adrenals/Urinary Tract: The adrenal glands are unremarkable. There is no hydronephrosis or nephrolithiasis on either side. Bilateral perinephric stranding,  nonspecific. Correlation with urinalysis recommended to exclude UTI. The visualized ureters and urinary bladder appear unremarkable. Stomach/Bowel: There is no bowel obstruction or active inflammation. Appendectomy. Vascular/Lymphatic: Minimal aortoiliac atherosclerotic disease. The IVC is unremarkable. No portal venous gas. There is no adenopathy. Reproductive: The prostate and seminal vesicles are grossly unremarkable. No pelvic mass. Other: Top-normal left inguinal lymph nodes, likely reactive. Small fat containing umbilical hernia. Musculoskeletal: No acute or significant osseous findings. IMPRESSION: 1. No hydronephrosis or nephrolithiasis. Correlation with urinalysis recommended to exclude UTI. 2. Cholelithiasis. 3. No bowel obstruction. Appendectomy. 4.  Aortic Atherosclerosis (ICD10-I70.0). Electronically Signed   By: Elgie Collard M.D.   On: 07/24/2022 17:58   DG Chest 2 View  Result Date: 07/24/2022 CLINICAL DATA:  Cough and fever for 3 days.  Shortness of breath. EXAM: CHEST - 2 VIEW COMPARISON:  07/03/2021 FINDINGS: The heart size and mediastinal contours are within normal limits. Both lungs are clear. The visualized skeletal structures are unremarkable. IMPRESSION: No active cardiopulmonary disease. Electronically Signed   By: Danae Orleans M.D.   On: 07/24/2022 16:02    Procedures .Critical  Care  Performed by: Virgina Norfolk, DO Authorized by: Virgina Norfolk, DO   Critical care provider statement:    Critical care time (minutes):  35   Critical care was necessary to treat or prevent imminent or life-threatening deterioration of the following conditions:  Sepsis   Critical care was time spent personally by me on the following activities:  Blood draw for specimens, development of treatment plan with patient or surrogate, examination of patient, obtaining history from patient or surrogate, evaluation of patient's response to treatment, ordering and performing treatments and interventions, ordering and review of laboratory studies, pulse oximetry, ordering and review of radiographic studies, re-evaluation of patient's condition and review of old charts   I assumed direction of critical care for this patient from another provider in my specialty: no     Care discussed with: admitting provider       Medications Ordered in ED Medications  metroNIDAZOLE (FLAGYL) IVPB 500 mg (has no administration in time range)  ceFEPIme (MAXIPIME) 2 g in sodium chloride 0.9 % 100 mL IVPB (2 g Intravenous New Bag/Given 07/25/22 1753)  vancomycin (VANCOCIN) IVPB 1000 mg/200 mL premix (has no administration in time range)    And  vancomycin (VANCOCIN) IVPB 1000 mg/200 mL premix (has no administration in time range)  vancomycin (VANCOCIN) IVPB 1000 mg/200 mL premix (has no administration in time range)  0.9 %  sodium chloride infusion ( Intravenous New Bag/Given 07/25/22 1751)  insulin aspart (novoLOG) injection 8 Units (has no administration in time range)  sodium chloride 0.9 % bolus 1,000 mL (1,000 mLs Intravenous New Bag/Given 07/25/22 1748)  acetaminophen (TYLENOL) tablet 650 mg (650 mg Oral Given 07/25/22 1754)    ED Course/ Medical Decision Making/ A&P                             Medical Decision Making Amount and/or Complexity of Data Reviewed Labs: ordered. Radiology: ordered.  Risk OTC  drugs. Prescription drug management.   [name] is here with left foot wound.  Fever and tachycardia upon arrival.  He was seen yesterday for fever and was may be thought to have a UTI.  He had not noticed wound underneath his left foot and did not mention it to the provider yesterday.  He has a history of diabetes.  He noticed a foul  smell coming from the bottom of his left foot today but not sure how long it has been there for.  He continues to be febrile and tachycardic.  He had workup yesterday with elevated white count but no obvious source for infection.  Urinalysis equivocal for infection.  He did have blood cultures drawn which preliminarily which are currently negative.  Overall do suspect source for infection is the left foot.  My suspicion is that there is osteomyelitis.  Will repeat CBC, CMP and lactic acid ESR and CRP and cultures.  Will start IV vancomycin, IV cefepime and IV Flagyl.  Will get an x-ray of the left foot.  Will give IV fluids.  Per review of radiology report of x-ray of his left foot he does have some erosion of the head of the first proximal phalanx of the head of the first MTP.  I favor this to likely be osteomyelitis clinically given foot ulcer in this area.  He continues have a white count of 19 but lactic acid is normal.  Blood sugar in the 400s but is not in DKA.  Creatinine stable at 2.4.  When I went to go talk to patient about admission he states that currently his social situation does not allow him to be admitted.  He is sole provider with 3 kids at home with no one who can watch his kids.  Ultimately patient will stay for IV antibiotics but leave AGAINST MEDICAL ADVICE.  He thinks that he can figure out childcare at some point and come back to the hospital.  Ultimately I do recommend that he go to [facility] when he is able to figure out his childcare as I suspect he will need admission for further IV antibiotics and osteomyelitis workup.  I have talked with  pharmacy.  In case patient is unable to figure out the situation and does not come back he should continue on Bactrim instead of doxycycline.  Ultimately patient tolerated IV antibiotics well.  I gave him a dose of IV NovoLog.  I strongly encouraged him to return for further treatment.  He understands return precautions.  He understands the risks and benefits of leaving.  He understands that this could lead to death or permanent disability if he delays treatment any longer.   This chart was dictated using voice recognition software.  Despite best efforts to proofread,  errors can occur which can change the documentation meaning.         Final Clinical Impression(s) / ED Diagnoses Final diagnoses:  Sepsis, due to unspecified organism, unspecified whether acute organ dysfunction present [facility])  Diabetic foot infection [facility])    Rx / DC Orders ED Discharge Orders     None         Virgina Norfolk, DO 07/25/22 1838

## 2022-07-25 NOTE — Progress Notes (Signed)
Pharmacy Antibiotic Note  Timothy Heath is a 47 y.o. male admitted on 07/25/2022 with  wound infection .  Pharmacy has been consulted for cefepime and vancomycin dosing. Noted allergy however patient has tolerated cephalosporins. Appears kidney function near baseline, Scr: 2.61 has 2.1-2.7 on lab work from past several years via Care Everywhere.   Plan: Cefepime 2g q12h.  Give IV Vancomycin 2000mg  x 1 for loading dose, followed by IV Vancomycin 1000 every 24 hours for eAUC458.  Follow culture data for de-escalation.  Monitor renal function for dose adjustments as indicated.    Height: 6' (182.9 cm) Weight: 117 kg (257 lb 15 oz) IBW/kg (Calculated) : 77.6  Temp (24hrs), Avg:101.6 F (38.7 C), Min:100.2 F (37.9 C), Max:103 F (39.4 C)  Recent Labs  Lab 07/24/22 1534 07/24/22 1836  WBC 16.4*  --   CREATININE 2.61*  --   LATICACIDVEN  --  1.5    Estimated Creatinine Clearance: 47.7 mL/min (A) (by C-G formula based on SCr of 2.61 mg/dL (H)).    Allergies  Allergen Reactions   Bee Venom Anaphylaxis   Penicillins Anaphylaxis and Shortness Of Breath    Has patient had a PCN reaction causing immediate rash, facial/tongue/throat swelling, SOB or lightheadedness with hypotension:YES Has patient had a PCN reaction causing severe rash involving mucus membranes or skin necrosis:  NO Has patient had a PCN reaction that required hospitalization YES Has patient had a PCN reaction occurring within the last 10 years: NO If all of the above answers are "NO", then may proceed with Cephalosporin use.     Thank you for allowing pharmacy to be a part of this patient's care.  Estill Batten, PharmD, BCCCP  07/25/2022 5:05 PM

## 2022-07-26 LAB — URINE CULTURE: Culture: NO GROWTH

## 2022-07-26 LAB — CULTURE, BLOOD (ROUTINE X 2): Special Requests: ADEQUATE

## 2022-07-26 LAB — C-REACTIVE PROTEIN: CRP: 34.8 mg/dL — ABNORMAL HIGH (ref <1.0)

## 2022-07-27 LAB — CULTURE, BLOOD (ROUTINE X 2)
Culture: NO GROWTH
Special Requests: ADEQUATE

## 2022-07-28 DIAGNOSIS — L03116 Cellulitis of left lower limb: Secondary | ICD-10-CM | POA: Diagnosis not present

## 2022-07-28 DIAGNOSIS — Z794 Long term (current) use of insulin: Secondary | ICD-10-CM | POA: Diagnosis not present

## 2022-07-28 DIAGNOSIS — I10 Essential (primary) hypertension: Secondary | ICD-10-CM | POA: Diagnosis not present

## 2022-07-28 DIAGNOSIS — E08621 Diabetes mellitus due to underlying condition with foot ulcer: Secondary | ICD-10-CM | POA: Diagnosis not present

## 2022-07-28 DIAGNOSIS — R7989 Other specified abnormal findings of blood chemistry: Secondary | ICD-10-CM | POA: Diagnosis not present

## 2022-07-28 DIAGNOSIS — L97521 Non-pressure chronic ulcer of other part of left foot limited to breakdown of skin: Secondary | ICD-10-CM | POA: Diagnosis not present

## 2022-07-28 DIAGNOSIS — N179 Acute kidney failure, unspecified: Secondary | ICD-10-CM | POA: Diagnosis not present

## 2022-07-28 DIAGNOSIS — L89891 Pressure ulcer of other site, stage 1: Secondary | ICD-10-CM | POA: Diagnosis not present

## 2022-07-28 DIAGNOSIS — A419 Sepsis, unspecified organism: Secondary | ICD-10-CM | POA: Diagnosis not present

## 2022-07-28 DIAGNOSIS — R110 Nausea: Secondary | ICD-10-CM | POA: Diagnosis not present

## 2022-07-28 DIAGNOSIS — E1165 Type 2 diabetes mellitus with hyperglycemia: Secondary | ICD-10-CM | POA: Diagnosis not present

## 2022-07-28 DIAGNOSIS — E785 Hyperlipidemia, unspecified: Secondary | ICD-10-CM | POA: Diagnosis not present

## 2022-07-28 DIAGNOSIS — N184 Chronic kidney disease, stage 4 (severe): Secondary | ICD-10-CM | POA: Diagnosis not present

## 2022-07-28 DIAGNOSIS — E11621 Type 2 diabetes mellitus with foot ulcer: Secondary | ICD-10-CM | POA: Diagnosis not present

## 2022-07-28 LAB — CULTURE, BLOOD (ROUTINE X 2): Culture: NO GROWTH

## 2022-07-29 DIAGNOSIS — L089 Local infection of the skin and subcutaneous tissue, unspecified: Secondary | ICD-10-CM | POA: Diagnosis not present

## 2022-07-29 DIAGNOSIS — E1165 Type 2 diabetes mellitus with hyperglycemia: Secondary | ICD-10-CM | POA: Diagnosis not present

## 2022-07-29 DIAGNOSIS — I10 Essential (primary) hypertension: Secondary | ICD-10-CM | POA: Diagnosis not present

## 2022-07-29 DIAGNOSIS — L97521 Non-pressure chronic ulcer of other part of left foot limited to breakdown of skin: Secondary | ICD-10-CM | POA: Diagnosis not present

## 2022-07-29 DIAGNOSIS — N179 Acute kidney failure, unspecified: Secondary | ICD-10-CM | POA: Diagnosis not present

## 2022-07-29 DIAGNOSIS — E11628 Type 2 diabetes mellitus with other skin complications: Secondary | ICD-10-CM | POA: Diagnosis not present

## 2022-07-29 DIAGNOSIS — E11621 Type 2 diabetes mellitus with foot ulcer: Secondary | ICD-10-CM | POA: Diagnosis not present

## 2022-07-29 DIAGNOSIS — E785 Hyperlipidemia, unspecified: Secondary | ICD-10-CM | POA: Diagnosis not present

## 2022-07-29 DIAGNOSIS — N1832 Chronic kidney disease, stage 3b: Secondary | ICD-10-CM | POA: Diagnosis not present

## 2022-07-29 LAB — CULTURE, BLOOD (ROUTINE X 2)
Culture: NO GROWTH
Culture: NO GROWTH

## 2022-07-30 DIAGNOSIS — I10 Essential (primary) hypertension: Secondary | ICD-10-CM | POA: Diagnosis not present

## 2022-07-30 DIAGNOSIS — E11628 Type 2 diabetes mellitus with other skin complications: Secondary | ICD-10-CM | POA: Diagnosis not present

## 2022-07-30 DIAGNOSIS — L0889 Other specified local infections of the skin and subcutaneous tissue: Secondary | ICD-10-CM | POA: Diagnosis not present

## 2022-07-30 DIAGNOSIS — L97428 Non-pressure chronic ulcer of left heel and midfoot with other specified severity: Secondary | ICD-10-CM | POA: Diagnosis not present

## 2022-07-30 DIAGNOSIS — A419 Sepsis, unspecified organism: Secondary | ICD-10-CM | POA: Diagnosis not present

## 2022-07-30 DIAGNOSIS — L03116 Cellulitis of left lower limb: Secondary | ICD-10-CM | POA: Diagnosis not present

## 2022-07-30 DIAGNOSIS — E1122 Type 2 diabetes mellitus with diabetic chronic kidney disease: Secondary | ICD-10-CM | POA: Diagnosis not present

## 2022-07-30 DIAGNOSIS — Z880 Allergy status to penicillin: Secondary | ICD-10-CM | POA: Diagnosis not present

## 2022-07-30 DIAGNOSIS — Z888 Allergy status to other drugs, medicaments and biological substances status: Secondary | ICD-10-CM | POA: Diagnosis not present

## 2022-07-30 DIAGNOSIS — E785 Hyperlipidemia, unspecified: Secondary | ICD-10-CM | POA: Diagnosis not present

## 2022-07-30 DIAGNOSIS — L089 Local infection of the skin and subcutaneous tissue, unspecified: Secondary | ICD-10-CM | POA: Diagnosis not present

## 2022-07-30 DIAGNOSIS — I129 Hypertensive chronic kidney disease with stage 1 through stage 4 chronic kidney disease, or unspecified chronic kidney disease: Secondary | ICD-10-CM | POA: Diagnosis not present

## 2022-07-30 DIAGNOSIS — L97521 Non-pressure chronic ulcer of other part of left foot limited to breakdown of skin: Secondary | ICD-10-CM | POA: Diagnosis not present

## 2022-07-30 DIAGNOSIS — Z794 Long term (current) use of insulin: Secondary | ICD-10-CM | POA: Diagnosis not present

## 2022-07-30 DIAGNOSIS — E11621 Type 2 diabetes mellitus with foot ulcer: Secondary | ICD-10-CM | POA: Diagnosis not present

## 2022-07-30 DIAGNOSIS — D638 Anemia in other chronic diseases classified elsewhere: Secondary | ICD-10-CM | POA: Diagnosis not present

## 2022-07-30 DIAGNOSIS — E1169 Type 2 diabetes mellitus with other specified complication: Secondary | ICD-10-CM | POA: Diagnosis not present

## 2022-07-30 DIAGNOSIS — N1832 Chronic kidney disease, stage 3b: Secondary | ICD-10-CM | POA: Diagnosis not present

## 2022-07-30 DIAGNOSIS — Z79899 Other long term (current) drug therapy: Secondary | ICD-10-CM | POA: Diagnosis not present

## 2022-07-30 DIAGNOSIS — Z9109 Other allergy status, other than to drugs and biological substances: Secondary | ICD-10-CM | POA: Diagnosis not present

## 2022-07-30 DIAGNOSIS — R6520 Severe sepsis without septic shock: Secondary | ICD-10-CM | POA: Diagnosis not present

## 2022-07-30 DIAGNOSIS — E1165 Type 2 diabetes mellitus with hyperglycemia: Secondary | ICD-10-CM | POA: Diagnosis not present

## 2022-07-30 DIAGNOSIS — Z91030 Bee allergy status: Secondary | ICD-10-CM | POA: Diagnosis not present

## 2022-07-30 DIAGNOSIS — E871 Hypo-osmolality and hyponatremia: Secondary | ICD-10-CM | POA: Diagnosis not present

## 2022-07-30 DIAGNOSIS — N179 Acute kidney failure, unspecified: Secondary | ICD-10-CM | POA: Diagnosis not present

## 2022-07-30 LAB — CULTURE, BLOOD (ROUTINE X 2)

## 2022-07-31 DIAGNOSIS — N1832 Chronic kidney disease, stage 3b: Secondary | ICD-10-CM | POA: Diagnosis not present

## 2022-07-31 DIAGNOSIS — E11621 Type 2 diabetes mellitus with foot ulcer: Secondary | ICD-10-CM | POA: Diagnosis not present

## 2022-07-31 DIAGNOSIS — L089 Local infection of the skin and subcutaneous tissue, unspecified: Secondary | ICD-10-CM | POA: Diagnosis not present

## 2022-07-31 DIAGNOSIS — E1122 Type 2 diabetes mellitus with diabetic chronic kidney disease: Secondary | ICD-10-CM | POA: Diagnosis not present

## 2022-07-31 DIAGNOSIS — I129 Hypertensive chronic kidney disease with stage 1 through stage 4 chronic kidney disease, or unspecified chronic kidney disease: Secondary | ICD-10-CM | POA: Diagnosis not present

## 2022-07-31 DIAGNOSIS — E11628 Type 2 diabetes mellitus with other skin complications: Secondary | ICD-10-CM | POA: Diagnosis not present

## 2022-07-31 DIAGNOSIS — L97423 Non-pressure chronic ulcer of left heel and midfoot with necrosis of muscle: Secondary | ICD-10-CM | POA: Diagnosis not present

## 2022-08-01 DIAGNOSIS — L089 Local infection of the skin and subcutaneous tissue, unspecified: Secondary | ICD-10-CM | POA: Diagnosis not present

## 2022-08-01 DIAGNOSIS — E11628 Type 2 diabetes mellitus with other skin complications: Secondary | ICD-10-CM | POA: Diagnosis not present

## 2022-08-02 DIAGNOSIS — E11628 Type 2 diabetes mellitus with other skin complications: Secondary | ICD-10-CM | POA: Diagnosis not present

## 2022-08-02 DIAGNOSIS — L089 Local infection of the skin and subcutaneous tissue, unspecified: Secondary | ICD-10-CM | POA: Diagnosis not present

## 2022-08-03 DIAGNOSIS — E11628 Type 2 diabetes mellitus with other skin complications: Secondary | ICD-10-CM | POA: Diagnosis not present

## 2022-08-03 DIAGNOSIS — E871 Hypo-osmolality and hyponatremia: Secondary | ICD-10-CM | POA: Diagnosis not present

## 2022-08-03 DIAGNOSIS — E11621 Type 2 diabetes mellitus with foot ulcer: Secondary | ICD-10-CM | POA: Diagnosis not present

## 2022-08-03 DIAGNOSIS — E1122 Type 2 diabetes mellitus with diabetic chronic kidney disease: Secondary | ICD-10-CM | POA: Diagnosis not present

## 2022-08-03 DIAGNOSIS — R6520 Severe sepsis without septic shock: Secondary | ICD-10-CM | POA: Diagnosis not present

## 2022-08-03 DIAGNOSIS — A4101 Sepsis due to Methicillin susceptible Staphylococcus aureus: Secondary | ICD-10-CM | POA: Diagnosis not present

## 2022-08-03 DIAGNOSIS — I129 Hypertensive chronic kidney disease with stage 1 through stage 4 chronic kidney disease, or unspecified chronic kidney disease: Secondary | ICD-10-CM | POA: Diagnosis not present

## 2022-08-03 DIAGNOSIS — E1165 Type 2 diabetes mellitus with hyperglycemia: Secondary | ICD-10-CM | POA: Diagnosis not present

## 2022-08-03 DIAGNOSIS — I10 Essential (primary) hypertension: Secondary | ICD-10-CM | POA: Diagnosis not present

## 2022-08-03 DIAGNOSIS — E875 Hyperkalemia: Secondary | ICD-10-CM | POA: Diagnosis not present

## 2022-08-03 DIAGNOSIS — N1832 Chronic kidney disease, stage 3b: Secondary | ICD-10-CM | POA: Diagnosis not present

## 2022-08-03 DIAGNOSIS — L089 Local infection of the skin and subcutaneous tissue, unspecified: Secondary | ICD-10-CM | POA: Diagnosis not present

## 2022-08-03 DIAGNOSIS — N179 Acute kidney failure, unspecified: Secondary | ICD-10-CM | POA: Diagnosis not present

## 2022-08-03 DIAGNOSIS — Z89432 Acquired absence of left foot: Secondary | ICD-10-CM | POA: Diagnosis not present

## 2022-08-04 DIAGNOSIS — E11628 Type 2 diabetes mellitus with other skin complications: Secondary | ICD-10-CM | POA: Diagnosis not present

## 2022-08-04 DIAGNOSIS — L089 Local infection of the skin and subcutaneous tissue, unspecified: Secondary | ICD-10-CM | POA: Diagnosis not present

## 2022-08-04 DIAGNOSIS — N179 Acute kidney failure, unspecified: Secondary | ICD-10-CM | POA: Diagnosis not present

## 2022-08-04 DIAGNOSIS — E871 Hypo-osmolality and hyponatremia: Secondary | ICD-10-CM | POA: Diagnosis not present

## 2022-08-04 DIAGNOSIS — I10 Essential (primary) hypertension: Secondary | ICD-10-CM | POA: Diagnosis not present

## 2022-08-04 DIAGNOSIS — A4101 Sepsis due to Methicillin susceptible Staphylococcus aureus: Secondary | ICD-10-CM | POA: Diagnosis not present

## 2022-08-04 DIAGNOSIS — Z89432 Acquired absence of left foot: Secondary | ICD-10-CM | POA: Diagnosis not present

## 2022-08-04 DIAGNOSIS — R6520 Severe sepsis without septic shock: Secondary | ICD-10-CM | POA: Diagnosis not present

## 2022-08-04 DIAGNOSIS — E1165 Type 2 diabetes mellitus with hyperglycemia: Secondary | ICD-10-CM | POA: Diagnosis not present

## 2022-08-04 DIAGNOSIS — E875 Hyperkalemia: Secondary | ICD-10-CM | POA: Diagnosis not present

## 2022-08-05 DIAGNOSIS — Z89432 Acquired absence of left foot: Secondary | ICD-10-CM | POA: Diagnosis not present

## 2022-08-05 DIAGNOSIS — I10 Essential (primary) hypertension: Secondary | ICD-10-CM | POA: Diagnosis not present

## 2022-08-05 DIAGNOSIS — R6520 Severe sepsis without septic shock: Secondary | ICD-10-CM | POA: Diagnosis not present

## 2022-08-05 DIAGNOSIS — E875 Hyperkalemia: Secondary | ICD-10-CM | POA: Diagnosis not present

## 2022-08-05 DIAGNOSIS — E871 Hypo-osmolality and hyponatremia: Secondary | ICD-10-CM | POA: Diagnosis not present

## 2022-08-05 DIAGNOSIS — A4101 Sepsis due to Methicillin susceptible Staphylococcus aureus: Secondary | ICD-10-CM | POA: Diagnosis not present

## 2022-08-05 DIAGNOSIS — N179 Acute kidney failure, unspecified: Secondary | ICD-10-CM | POA: Diagnosis not present

## 2022-08-05 DIAGNOSIS — L089 Local infection of the skin and subcutaneous tissue, unspecified: Secondary | ICD-10-CM | POA: Diagnosis not present

## 2022-08-05 DIAGNOSIS — E11628 Type 2 diabetes mellitus with other skin complications: Secondary | ICD-10-CM | POA: Diagnosis not present

## 2022-08-05 DIAGNOSIS — E1165 Type 2 diabetes mellitus with hyperglycemia: Secondary | ICD-10-CM | POA: Diagnosis not present

## 2022-08-06 DIAGNOSIS — Z89432 Acquired absence of left foot: Secondary | ICD-10-CM | POA: Diagnosis not present

## 2022-08-06 DIAGNOSIS — E875 Hyperkalemia: Secondary | ICD-10-CM | POA: Diagnosis not present

## 2022-08-06 DIAGNOSIS — E11628 Type 2 diabetes mellitus with other skin complications: Secondary | ICD-10-CM | POA: Diagnosis not present

## 2022-08-06 DIAGNOSIS — R6520 Severe sepsis without septic shock: Secondary | ICD-10-CM | POA: Diagnosis not present

## 2022-08-06 DIAGNOSIS — E871 Hypo-osmolality and hyponatremia: Secondary | ICD-10-CM | POA: Diagnosis not present

## 2022-08-06 DIAGNOSIS — N179 Acute kidney failure, unspecified: Secondary | ICD-10-CM | POA: Diagnosis not present

## 2022-08-06 DIAGNOSIS — A4101 Sepsis due to Methicillin susceptible Staphylococcus aureus: Secondary | ICD-10-CM | POA: Diagnosis not present

## 2022-08-06 DIAGNOSIS — E1165 Type 2 diabetes mellitus with hyperglycemia: Secondary | ICD-10-CM | POA: Diagnosis not present

## 2022-08-06 DIAGNOSIS — I10 Essential (primary) hypertension: Secondary | ICD-10-CM | POA: Diagnosis not present

## 2022-08-06 DIAGNOSIS — L089 Local infection of the skin and subcutaneous tissue, unspecified: Secondary | ICD-10-CM | POA: Diagnosis not present

## 2022-08-07 DIAGNOSIS — N179 Acute kidney failure, unspecified: Secondary | ICD-10-CM | POA: Diagnosis not present

## 2022-08-07 DIAGNOSIS — E871 Hypo-osmolality and hyponatremia: Secondary | ICD-10-CM | POA: Diagnosis not present

## 2022-08-07 DIAGNOSIS — A4101 Sepsis due to Methicillin susceptible Staphylococcus aureus: Secondary | ICD-10-CM | POA: Diagnosis not present

## 2022-08-07 DIAGNOSIS — E11628 Type 2 diabetes mellitus with other skin complications: Secondary | ICD-10-CM | POA: Diagnosis not present

## 2022-08-07 DIAGNOSIS — E875 Hyperkalemia: Secondary | ICD-10-CM | POA: Diagnosis not present

## 2022-08-07 DIAGNOSIS — Z89432 Acquired absence of left foot: Secondary | ICD-10-CM | POA: Diagnosis not present

## 2022-08-07 DIAGNOSIS — I10 Essential (primary) hypertension: Secondary | ICD-10-CM | POA: Diagnosis not present

## 2022-08-07 DIAGNOSIS — L089 Local infection of the skin and subcutaneous tissue, unspecified: Secondary | ICD-10-CM | POA: Diagnosis not present

## 2022-08-07 DIAGNOSIS — R6520 Severe sepsis without septic shock: Secondary | ICD-10-CM | POA: Diagnosis not present

## 2022-08-07 DIAGNOSIS — E1165 Type 2 diabetes mellitus with hyperglycemia: Secondary | ICD-10-CM | POA: Diagnosis not present

## 2022-08-23 DIAGNOSIS — E11628 Type 2 diabetes mellitus with other skin complications: Secondary | ICD-10-CM | POA: Diagnosis not present

## 2022-08-23 DIAGNOSIS — D500 Iron deficiency anemia secondary to blood loss (chronic): Secondary | ICD-10-CM | POA: Diagnosis not present

## 2022-08-23 DIAGNOSIS — E1159 Type 2 diabetes mellitus with other circulatory complications: Secondary | ICD-10-CM | POA: Diagnosis not present

## 2022-08-23 DIAGNOSIS — Z89432 Acquired absence of left foot: Secondary | ICD-10-CM | POA: Diagnosis not present

## 2022-08-23 DIAGNOSIS — E871 Hypo-osmolality and hyponatremia: Secondary | ICD-10-CM | POA: Diagnosis not present

## 2022-08-23 DIAGNOSIS — L089 Local infection of the skin and subcutaneous tissue, unspecified: Secondary | ICD-10-CM | POA: Diagnosis not present

## 2022-08-23 DIAGNOSIS — N179 Acute kidney failure, unspecified: Secondary | ICD-10-CM | POA: Diagnosis not present

## 2022-08-23 DIAGNOSIS — I152 Hypertension secondary to endocrine disorders: Secondary | ICD-10-CM | POA: Diagnosis not present

## 2022-08-23 DIAGNOSIS — Z09 Encounter for follow-up examination after completed treatment for conditions other than malignant neoplasm: Secondary | ICD-10-CM | POA: Diagnosis not present

## 2022-08-23 DIAGNOSIS — E1165 Type 2 diabetes mellitus with hyperglycemia: Secondary | ICD-10-CM | POA: Diagnosis not present

## 2022-08-23 DIAGNOSIS — E875 Hyperkalemia: Secondary | ICD-10-CM | POA: Diagnosis not present

## 2022-08-25 DIAGNOSIS — E1165 Type 2 diabetes mellitus with hyperglycemia: Secondary | ICD-10-CM | POA: Diagnosis not present

## 2022-08-25 DIAGNOSIS — I129 Hypertensive chronic kidney disease with stage 1 through stage 4 chronic kidney disease, or unspecified chronic kidney disease: Secondary | ICD-10-CM | POA: Diagnosis not present

## 2022-08-25 DIAGNOSIS — Z888 Allergy status to other drugs, medicaments and biological substances status: Secondary | ICD-10-CM | POA: Diagnosis not present

## 2022-08-25 DIAGNOSIS — L97521 Non-pressure chronic ulcer of other part of left foot limited to breakdown of skin: Secondary | ICD-10-CM | POA: Diagnosis not present

## 2022-08-25 DIAGNOSIS — N183 Chronic kidney disease, stage 3 unspecified: Secondary | ICD-10-CM | POA: Diagnosis not present

## 2022-08-25 DIAGNOSIS — Z881 Allergy status to other antibiotic agents status: Secondary | ICD-10-CM | POA: Diagnosis not present

## 2022-08-25 DIAGNOSIS — N1832 Chronic kidney disease, stage 3b: Secondary | ICD-10-CM | POA: Diagnosis not present

## 2022-08-25 DIAGNOSIS — Z7985 Long-term (current) use of injectable non-insulin antidiabetic drugs: Secondary | ICD-10-CM | POA: Diagnosis not present

## 2022-08-25 DIAGNOSIS — Z79899 Other long term (current) drug therapy: Secondary | ICD-10-CM | POA: Diagnosis not present

## 2022-08-25 DIAGNOSIS — Z794 Long term (current) use of insulin: Secondary | ICD-10-CM | POA: Diagnosis not present

## 2022-08-25 DIAGNOSIS — E1122 Type 2 diabetes mellitus with diabetic chronic kidney disease: Secondary | ICD-10-CM | POA: Diagnosis not present

## 2022-08-25 DIAGNOSIS — Z89432 Acquired absence of left foot: Secondary | ICD-10-CM | POA: Diagnosis not present

## 2022-08-25 DIAGNOSIS — E785 Hyperlipidemia, unspecified: Secondary | ICD-10-CM | POA: Diagnosis not present

## 2022-08-25 DIAGNOSIS — L97929 Non-pressure chronic ulcer of unspecified part of left lower leg with unspecified severity: Secondary | ICD-10-CM | POA: Diagnosis not present

## 2022-08-25 DIAGNOSIS — E1169 Type 2 diabetes mellitus with other specified complication: Secondary | ICD-10-CM | POA: Diagnosis not present

## 2022-08-25 DIAGNOSIS — E11621 Type 2 diabetes mellitus with foot ulcer: Secondary | ICD-10-CM | POA: Diagnosis not present

## 2022-08-25 DIAGNOSIS — Z7984 Long term (current) use of oral hypoglycemic drugs: Secondary | ICD-10-CM | POA: Diagnosis not present

## 2022-08-25 DIAGNOSIS — Z880 Allergy status to penicillin: Secondary | ICD-10-CM | POA: Diagnosis not present

## 2022-09-06 DIAGNOSIS — L97309 Non-pressure chronic ulcer of unspecified ankle with unspecified severity: Secondary | ICD-10-CM | POA: Diagnosis not present

## 2022-09-06 DIAGNOSIS — E11628 Type 2 diabetes mellitus with other skin complications: Secondary | ICD-10-CM | POA: Diagnosis not present

## 2022-09-06 DIAGNOSIS — Z4789 Encounter for other orthopedic aftercare: Secondary | ICD-10-CM | POA: Diagnosis not present

## 2022-09-06 DIAGNOSIS — E11622 Type 2 diabetes mellitus with other skin ulcer: Secondary | ICD-10-CM | POA: Diagnosis not present

## 2022-09-06 DIAGNOSIS — L089 Local infection of the skin and subcutaneous tissue, unspecified: Secondary | ICD-10-CM | POA: Diagnosis not present

## 2022-09-20 DIAGNOSIS — L97422 Non-pressure chronic ulcer of left heel and midfoot with fat layer exposed: Secondary | ICD-10-CM | POA: Diagnosis not present

## 2022-09-20 DIAGNOSIS — E08621 Diabetes mellitus due to underlying condition with foot ulcer: Secondary | ICD-10-CM | POA: Diagnosis not present

## 2022-09-20 DIAGNOSIS — Z89432 Acquired absence of left foot: Secondary | ICD-10-CM | POA: Diagnosis not present

## 2022-10-05 DIAGNOSIS — L089 Local infection of the skin and subcutaneous tissue, unspecified: Secondary | ICD-10-CM | POA: Diagnosis not present

## 2022-10-05 DIAGNOSIS — E11628 Type 2 diabetes mellitus with other skin complications: Secondary | ICD-10-CM | POA: Diagnosis not present

## 2022-10-08 DIAGNOSIS — L97521 Non-pressure chronic ulcer of other part of left foot limited to breakdown of skin: Secondary | ICD-10-CM | POA: Diagnosis not present

## 2022-10-08 DIAGNOSIS — E11621 Type 2 diabetes mellitus with foot ulcer: Secondary | ICD-10-CM | POA: Diagnosis not present

## 2022-10-10 DIAGNOSIS — Z419 Encounter for procedure for purposes other than remedying health state, unspecified: Secondary | ICD-10-CM | POA: Diagnosis not present

## 2022-10-21 DIAGNOSIS — E11628 Type 2 diabetes mellitus with other skin complications: Secondary | ICD-10-CM | POA: Diagnosis not present

## 2022-10-21 DIAGNOSIS — L97309 Non-pressure chronic ulcer of unspecified ankle with unspecified severity: Secondary | ICD-10-CM | POA: Diagnosis not present

## 2022-10-21 DIAGNOSIS — L97422 Non-pressure chronic ulcer of left heel and midfoot with fat layer exposed: Secondary | ICD-10-CM | POA: Diagnosis not present

## 2022-10-21 DIAGNOSIS — L089 Local infection of the skin and subcutaneous tissue, unspecified: Secondary | ICD-10-CM | POA: Diagnosis not present

## 2022-10-27 DIAGNOSIS — L089 Local infection of the skin and subcutaneous tissue, unspecified: Secondary | ICD-10-CM | POA: Diagnosis not present

## 2022-10-27 DIAGNOSIS — Z4789 Encounter for other orthopedic aftercare: Secondary | ICD-10-CM | POA: Diagnosis not present

## 2022-10-27 DIAGNOSIS — L97422 Non-pressure chronic ulcer of left heel and midfoot with fat layer exposed: Secondary | ICD-10-CM | POA: Diagnosis not present

## 2022-10-27 DIAGNOSIS — E08621 Diabetes mellitus due to underlying condition with foot ulcer: Secondary | ICD-10-CM | POA: Diagnosis not present

## 2022-10-27 DIAGNOSIS — Z89432 Acquired absence of left foot: Secondary | ICD-10-CM | POA: Diagnosis not present

## 2022-10-27 DIAGNOSIS — L97309 Non-pressure chronic ulcer of unspecified ankle with unspecified severity: Secondary | ICD-10-CM | POA: Diagnosis not present

## 2022-11-03 DIAGNOSIS — Z4789 Encounter for other orthopedic aftercare: Secondary | ICD-10-CM | POA: Diagnosis not present

## 2022-11-03 DIAGNOSIS — L97422 Non-pressure chronic ulcer of left heel and midfoot with fat layer exposed: Secondary | ICD-10-CM | POA: Diagnosis not present

## 2022-11-03 DIAGNOSIS — Z89432 Acquired absence of left foot: Secondary | ICD-10-CM | POA: Diagnosis not present

## 2022-11-03 DIAGNOSIS — E08621 Diabetes mellitus due to underlying condition with foot ulcer: Secondary | ICD-10-CM | POA: Diagnosis not present

## 2022-11-22 DIAGNOSIS — E11628 Type 2 diabetes mellitus with other skin complications: Secondary | ICD-10-CM | POA: Diagnosis not present

## 2022-11-22 DIAGNOSIS — L97422 Non-pressure chronic ulcer of left heel and midfoot with fat layer exposed: Secondary | ICD-10-CM | POA: Diagnosis not present

## 2022-11-22 DIAGNOSIS — L089 Local infection of the skin and subcutaneous tissue, unspecified: Secondary | ICD-10-CM | POA: Diagnosis not present

## 2022-11-22 DIAGNOSIS — Z89432 Acquired absence of left foot: Secondary | ICD-10-CM | POA: Diagnosis not present

## 2022-12-02 ENCOUNTER — Telehealth: Payer: Self-pay

## 2022-12-02 NOTE — Transitions of Care (Post Inpatient/ED Visit) (Signed)
12/02/2022  Name: Timothy Heath MRN: [id number] DOB: [birth year]  Today's TOC FU Call Status: Today's TOC FU Call Status:: Unsuccessful Call (1st Attempt) Unsuccessful Call (1st Attempt) Date: 12/02/22  Attempted to reach the patient regarding the most recent Inpatient/ED visit.  Follow Up Plan: Additional outreach attempts will be made to reach the patient to complete the Transitions of Care (Post Inpatient/ED visit) call.   Alyse Low, RN, BA, [facility], CRRN [facility] Coordinator, Transition of Care Ph # [phone]

## 2022-12-03 ENCOUNTER — Telehealth: Payer: Self-pay

## 2022-12-03 NOTE — Transitions of Care (Post Inpatient/ED Visit) (Signed)
12/03/2022  Name: Timothy Heath MRN: [id number] DOB: Jul 29, 1975  Today's TOC FU Call Status: Today's TOC FU Call Status:: Unsuccessful Call (2nd Attempt) Unsuccessful Call (2nd Attempt) Date: 12/03/22  Attempted to reach the patient regarding the most recent Inpatient/ED visit.  Follow Up Plan: Additional outreach attempts will be made to reach the patient to complete the Transitions of Care (Post Inpatient/ED visit) call.   Alyse Low, RN, BA, [facility], CRRN [facility] Coordinator, Transition of Care Ph # [phone]

## 2022-12-06 ENCOUNTER — Telehealth: Payer: Self-pay

## 2022-12-06 NOTE — Transitions of Care (Post Inpatient/ED Visit) (Signed)
12/06/2022  Name: HRAG LITAKER MRN: [id number] DOB: [birth year]  Today's TOC FU Call Status: Today's TOC FU Call Status:: Unsuccessful Call (3rd Attempt) Unsuccessful Call (3rd Attempt) Date: 12/06/22  Attempted to reach the patient regarding the most recent Inpatient/ED visit.  Follow Up Plan: No further outreach attempts will be made at this time. We have been unable to contact the patient.  Alyse Low, RN, BA, [facility], CRRN [facility] Coordinator, Transition of Care Ph # [phone]
